# Patient Record
Sex: Male | Born: 1935 | Race: White | Hispanic: No | Marital: Married | Smoking: Former smoker
Health system: Southern US, Community
[De-identification: ages and names within clinical notes are randomized; demographics above are authoritative.]

## PROBLEM LIST (undated history)

## (undated) DIAGNOSIS — I6529 Occlusion and stenosis of unspecified carotid artery: Secondary | ICD-10-CM

## (undated) DIAGNOSIS — M109 Gout, unspecified: Secondary | ICD-10-CM

## (undated) DIAGNOSIS — I4891 Unspecified atrial fibrillation: Secondary | ICD-10-CM

## (undated) DIAGNOSIS — I10 Essential (primary) hypertension: Secondary | ICD-10-CM

## (undated) DIAGNOSIS — E785 Hyperlipidemia, unspecified: Secondary | ICD-10-CM

## (undated) DIAGNOSIS — I2510 Atherosclerotic heart disease of native coronary artery without angina pectoris: Secondary | ICD-10-CM

## (undated) HISTORY — DX: Gout, unspecified: M10.9

## (undated) HISTORY — PX: KNEE SURGERY: SHX244

## (undated) HISTORY — DX: Occlusion and stenosis of unspecified carotid artery: I65.29

## (undated) HISTORY — DX: Essential (primary) hypertension: I10

## (undated) HISTORY — PX: CAROTID ENDARTERECTOMY: SUR193

## (undated) HISTORY — DX: Unspecified atrial fibrillation: I48.91

## (undated) HISTORY — DX: Hyperlipidemia, unspecified: E78.5

## (undated) HISTORY — PX: CARDIAC CATHETERIZATION: SHX172

## (undated) HISTORY — DX: Atherosclerotic heart disease of native coronary artery without angina pectoris: I25.10

---

## 2006-01-08 ENCOUNTER — Ambulatory Visit: Payer: Self-pay | Admitting: Internal Medicine

## 2007-04-26 ENCOUNTER — Ambulatory Visit: Payer: Self-pay

## 2008-08-05 ENCOUNTER — Ambulatory Visit: Payer: Self-pay | Admitting: General Practice

## 2008-08-22 ENCOUNTER — Inpatient Hospital Stay: Payer: Self-pay | Admitting: General Practice

## 2008-11-25 ENCOUNTER — Emergency Department: Payer: Self-pay | Admitting: Emergency Medicine

## 2009-05-31 ENCOUNTER — Emergency Department: Payer: Self-pay | Admitting: Emergency Medicine

## 2012-06-17 ENCOUNTER — Inpatient Hospital Stay: Payer: Self-pay | Admitting: Internal Medicine

## 2012-06-17 LAB — COMPREHENSIVE METABOLIC PANEL
Albumin: 4.1 g/dL (ref 3.4–5.0)
Anion Gap: 7 (ref 7–16)
Bilirubin,Total: 0.7 mg/dL (ref 0.2–1.0)
Calcium, Total: 9 mg/dL (ref 8.5–10.1)
Chloride: 110 mmol/L — ABNORMAL HIGH (ref 98–107)
Co2: 24 mmol/L (ref 21–32)
Creatinine: 2 mg/dL — ABNORMAL HIGH (ref 0.60–1.30)
Osmolality: 286 (ref 275–301)
Potassium: 3.7 mmol/L (ref 3.5–5.1)
SGOT(AST): 20 U/L (ref 15–37)
Total Protein: 7.7 g/dL (ref 6.4–8.2)

## 2012-06-17 LAB — CBC
MCHC: 34.4 g/dL (ref 32.0–36.0)
MCV: 88 fL (ref 80–100)
Platelet: 176 10*3/uL (ref 150–440)
RBC: 5.04 10*6/uL (ref 4.40–5.90)
RDW: 13.2 % (ref 11.5–14.5)

## 2012-06-17 LAB — CK TOTAL AND CKMB (NOT AT ARMC)
CK, Total: 86 U/L (ref 35–232)
CK-MB: 2.4 ng/mL (ref 0.5–3.6)

## 2012-06-17 LAB — URINALYSIS, COMPLETE
Bacteria: NONE SEEN
Bilirubin,UR: NEGATIVE
Blood: NEGATIVE
Glucose,UR: NEGATIVE mg/dL (ref 0–75)
Hyaline Cast: 3
Protein: NEGATIVE
WBC UR: 1 /HPF (ref 0–5)

## 2012-06-17 LAB — TROPONIN I: Troponin-I: <0.02 ng/mL

## 2012-06-18 LAB — BASIC METABOLIC PANEL
Anion Gap: 10 (ref 7–16)
BUN: 21 mg/dL — ABNORMAL HIGH (ref 7–18)
Calcium, Total: 8.5 mg/dL (ref 8.5–10.1)
Chloride: 108 mmol/L — ABNORMAL HIGH (ref 98–107)
Co2: 23 mmol/L (ref 21–32)
Creatinine: 1.64 mg/dL — ABNORMAL HIGH (ref 0.60–1.30)
EGFR (African American): 43 — ABNORMAL LOW
EGFR (Non-African Amer.): 37 — ABNORMAL LOW
Glucose: 93 mg/dL (ref 65–99)
Osmolality: 284 (ref 275–301)
Potassium: 3.7 mmol/L (ref 3.5–5.1)
Sodium: 141 mmol/L (ref 136–145)

## 2012-06-19 LAB — BASIC METABOLIC PANEL
BUN: 30 mg/dL — ABNORMAL HIGH (ref 7–18)
Chloride: 111 mmol/L — ABNORMAL HIGH (ref 98–107)
Co2: 20 mmol/L — ABNORMAL LOW (ref 21–32)
EGFR (African American): 33 — ABNORMAL LOW
EGFR (Non-African Amer.): 28 — ABNORMAL LOW
Glucose: 165 mg/dL — ABNORMAL HIGH (ref 65–99)
Sodium: 140 mmol/L (ref 136–145)

## 2012-06-20 LAB — BASIC METABOLIC PANEL
Anion Gap: 9 (ref 7–16)
EGFR (African American): 37 — ABNORMAL LOW
Glucose: 149 mg/dL — ABNORMAL HIGH (ref 65–99)
Potassium: 4.1 mmol/L (ref 3.5–5.1)

## 2012-06-20 LAB — PROTIME-INR
INR: 1
Prothrombin Time: 13.9 secs (ref 11.5–14.7)

## 2012-07-03 ENCOUNTER — Ambulatory Visit: Payer: Self-pay | Admitting: Internal Medicine

## 2012-07-03 LAB — BASIC METABOLIC PANEL
Anion Gap: 6 — ABNORMAL LOW (ref 7–16)
BUN: 22 mg/dL — ABNORMAL HIGH (ref 7–18)
Co2: 29 mmol/L (ref 21–32)
Creatinine: 1.52 mg/dL — ABNORMAL HIGH (ref 0.60–1.30)
EGFR (African American): 47 — ABNORMAL LOW
Osmolality: 289 (ref 275–301)

## 2012-07-03 LAB — CBC WITH DIFFERENTIAL/PLATELET
Eosinophil #: 0.1 10*3/uL (ref 0.0–0.7)
Eosinophil %: 1.6 %
MCHC: 33.2 g/dL (ref 32.0–36.0)
MCV: 89 fL (ref 80–100)
Monocyte #: 0.5 x10 3/mm (ref 0.2–1.0)
Monocyte %: 7.3 %
Neutrophil #: 5.3 10*3/uL (ref 1.4–6.5)
Neutrophil %: 80.2 %
Platelet: 155 10*3/uL (ref 150–440)
RBC: 4.61 10*6/uL (ref 4.40–5.90)
RDW: 13.4 % (ref 11.5–14.5)

## 2012-07-03 LAB — PROTIME-INR: INR: 1.1

## 2012-07-04 LAB — CK-MB: CK-MB: 3.7 ng/mL — ABNORMAL HIGH (ref 0.5–3.6)

## 2012-07-04 LAB — BASIC METABOLIC PANEL
Anion Gap: 8 (ref 7–16)
Creatinine: 1.58 mg/dL — ABNORMAL HIGH (ref 0.60–1.30)
EGFR (African American): 45 — ABNORMAL LOW
EGFR (Non-African Amer.): 39 — ABNORMAL LOW
Potassium: 4.1 mmol/L (ref 3.5–5.1)

## 2012-07-11 ENCOUNTER — Emergency Department: Payer: Self-pay | Admitting: Emergency Medicine

## 2012-07-11 LAB — PROTIME-INR
INR: 1.2
Prothrombin Time: 15.4 secs — ABNORMAL HIGH (ref 11.5–14.7)

## 2012-07-11 LAB — CBC
HCT: 39.8 % — ABNORMAL LOW (ref 40.0–52.0)
HGB: 12.8 g/dL — ABNORMAL LOW (ref 13.0–18.0)
MCH: 28.4 pg (ref 26.0–34.0)
MCV: 89 fL (ref 80–100)
Platelet: 156 10*3/uL (ref 150–440)
RBC: 4.48 10*6/uL (ref 4.40–5.90)

## 2012-07-11 LAB — COMPREHENSIVE METABOLIC PANEL
Alkaline Phosphatase: 78 U/L (ref 50–136)
Anion Gap: 7 (ref 7–16)
Chloride: 108 mmol/L — ABNORMAL HIGH (ref 98–107)
Co2: 25 mmol/L (ref 21–32)
Creatinine: 1.71 mg/dL — ABNORMAL HIGH (ref 0.60–1.30)
Glucose: 150 mg/dL — ABNORMAL HIGH (ref 65–99)
Osmolality: 283 (ref 275–301)
SGOT(AST): 15 U/L (ref 15–37)
SGPT (ALT): 29 U/L (ref 12–78)
Sodium: 140 mmol/L (ref 136–145)

## 2012-07-11 LAB — URIC ACID: Uric Acid: 5 mg/dL (ref 3.5–7.2)

## 2012-07-11 LAB — TROPONIN I: Troponin-I: 0.05 ng/mL

## 2012-07-11 LAB — CK TOTAL AND CKMB (NOT AT ARMC)
CK, Total: 36 U/L (ref 35–232)
CK-MB: 0.7 ng/mL (ref 0.5–3.6)

## 2012-07-11 LAB — APTT: Activated PTT: 29.2 secs (ref 23.6–35.9)

## 2012-12-20 ENCOUNTER — Ambulatory Visit: Payer: Self-pay | Admitting: Ophthalmology

## 2013-01-02 ENCOUNTER — Ambulatory Visit: Payer: Self-pay | Admitting: Ophthalmology

## 2013-02-24 ENCOUNTER — Inpatient Hospital Stay: Payer: Self-pay | Admitting: Family Medicine

## 2013-02-24 LAB — URINALYSIS, COMPLETE
Glucose,UR: NEGATIVE mg/dL (ref 0–75)
Ketone: NEGATIVE
Nitrite: POSITIVE
Ph: 5 (ref 4.5–8.0)
Specific Gravity: 1.01 (ref 1.003–1.030)
WBC UR: 234 /HPF (ref 0–5)

## 2013-02-24 LAB — COMPREHENSIVE METABOLIC PANEL
Albumin: 3.6 g/dL (ref 3.4–5.0)
Alkaline Phosphatase: 92 U/L (ref 50–136)
Anion Gap: 7 (ref 7–16)
BUN: 25 mg/dL — ABNORMAL HIGH (ref 7–18)
Bilirubin,Total: 1.5 mg/dL — ABNORMAL HIGH (ref 0.2–1.0)
Chloride: 105 mmol/L (ref 98–107)
Glucose: 105 mg/dL — ABNORMAL HIGH (ref 65–99)
Osmolality: 280 (ref 275–301)
Potassium: 3.7 mmol/L (ref 3.5–5.1)
SGOT(AST): 31 U/L (ref 15–37)
Sodium: 138 mmol/L (ref 136–145)
Total Protein: 7.2 g/dL (ref 6.4–8.2)

## 2013-02-24 LAB — CBC
HCT: 43.9 % (ref 40.0–52.0)
HGB: 15 g/dL (ref 13.0–18.0)
MCHC: 34.1 g/dL (ref 32.0–36.0)
MCV: 88 fL (ref 80–100)
RBC: 4.97 10*6/uL (ref 4.40–5.90)
RDW: 16.1 % — ABNORMAL HIGH (ref 11.5–14.5)
WBC: 9.9 10*3/uL (ref 3.8–10.6)

## 2013-02-24 LAB — APTT: Activated PTT: 58.3 secs — ABNORMAL HIGH (ref 23.6–35.9)

## 2013-02-25 LAB — CBC WITH DIFFERENTIAL/PLATELET
Basophil #: 0 10*3/uL (ref 0.0–0.1)
Eosinophil %: 0.2 %
HCT: 36.9 % — ABNORMAL LOW (ref 40.0–52.0)
Lymphocyte #: 0.9 10*3/uL — ABNORMAL LOW (ref 1.0–3.6)
Lymphocyte %: 6.7 %
MCHC: 33.8 g/dL (ref 32.0–36.0)
Monocyte #: 1.7 x10 3/mm — ABNORMAL HIGH (ref 0.2–1.0)
Neutrophil %: 80.5 %
RBC: 4.16 10*6/uL — ABNORMAL LOW (ref 4.40–5.90)
WBC: 14 10*3/uL — ABNORMAL HIGH (ref 3.8–10.6)

## 2013-02-25 LAB — BASIC METABOLIC PANEL
Anion Gap: 7 (ref 7–16)
Anion Gap: 7 (ref 7–16)
BUN: 34 mg/dL — ABNORMAL HIGH (ref 7–18)
Calcium, Total: 8.4 mg/dL — ABNORMAL LOW (ref 8.5–10.1)
Chloride: 103 mmol/L (ref 98–107)
Co2: 25 mmol/L (ref 21–32)
EGFR (African American): 28 — ABNORMAL LOW
EGFR (Non-African Amer.): 25 — ABNORMAL LOW
Glucose: 114 mg/dL — ABNORMAL HIGH (ref 65–99)
Glucose: 116 mg/dL — ABNORMAL HIGH (ref 65–99)
Osmolality: 278 (ref 275–301)
Potassium: 3.8 mmol/L (ref 3.5–5.1)
Potassium: 4.4 mmol/L (ref 3.5–5.1)
Sodium: 136 mmol/L (ref 136–145)

## 2013-02-25 LAB — PROTIME-INR: Prothrombin Time: 34.2 secs — ABNORMAL HIGH (ref 11.5–14.7)

## 2013-02-26 LAB — CBC WITH DIFFERENTIAL/PLATELET
Basophil %: 0.3 %
Eosinophil #: 0.1 10*3/uL (ref 0.0–0.7)
HGB: 12.7 g/dL — ABNORMAL LOW (ref 13.0–18.0)
Lymphocyte %: 5.2 %
MCH: 29.9 pg (ref 26.0–34.0)
MCHC: 33.9 g/dL (ref 32.0–36.0)
MCV: 88 fL (ref 80–100)
Neutrophil %: 83.2 %
RBC: 4.23 10*6/uL — ABNORMAL LOW (ref 4.40–5.90)
WBC: 12.5 10*3/uL — ABNORMAL HIGH (ref 3.8–10.6)

## 2013-02-26 LAB — BASIC METABOLIC PANEL
Chloride: 105 mmol/L (ref 98–107)
Co2: 24 mmol/L (ref 21–32)
Glucose: 118 mg/dL — ABNORMAL HIGH (ref 65–99)
Osmolality: 285 (ref 275–301)
Sodium: 138 mmol/L (ref 136–145)

## 2013-02-26 LAB — PROTIME-INR: INR: 2.8

## 2013-02-27 LAB — BASIC METABOLIC PANEL
Anion Gap: 10 (ref 7–16)
Calcium, Total: 8.9 mg/dL (ref 8.5–10.1)
Chloride: 106 mmol/L (ref 98–107)
Co2: 21 mmol/L (ref 21–32)
Creatinine: 1.71 mg/dL — ABNORMAL HIGH (ref 0.60–1.30)
EGFR (Non-African Amer.): 35 — ABNORMAL LOW
Glucose: 103 mg/dL — ABNORMAL HIGH (ref 65–99)
Osmolality: 281 (ref 275–301)
Potassium: 3.8 mmol/L (ref 3.5–5.1)
Sodium: 137 mmol/L (ref 136–145)

## 2013-02-27 LAB — PROTIME-INR
INR: 2.2
Prothrombin Time: 24.1 secs — ABNORMAL HIGH (ref 11.5–14.7)

## 2013-02-28 LAB — PROTIME-INR
INR: 2
Prothrombin Time: 22.6 secs — ABNORMAL HIGH (ref 11.5–14.7)

## 2013-02-28 LAB — CREATININE, SERUM
Creatinine: 1.72 mg/dL — ABNORMAL HIGH (ref 0.60–1.30)
EGFR (African American): 41 — ABNORMAL LOW

## 2013-03-01 LAB — PROTIME-INR: Prothrombin Time: 25.4 secs — ABNORMAL HIGH (ref 11.5–14.7)

## 2013-03-01 LAB — BASIC METABOLIC PANEL
BUN: 38 mg/dL — ABNORMAL HIGH (ref 7–18)
Calcium, Total: 9.3 mg/dL (ref 8.5–10.1)
Chloride: 104 mmol/L (ref 98–107)
Co2: 24 mmol/L (ref 21–32)
Glucose: 150 mg/dL — ABNORMAL HIGH (ref 65–99)

## 2013-03-01 LAB — CULTURE, BLOOD (SINGLE)

## 2013-06-11 ENCOUNTER — Encounter: Payer: Self-pay | Admitting: Podiatry

## 2013-06-11 ENCOUNTER — Ambulatory Visit (INDEPENDENT_AMBULATORY_CARE_PROVIDER_SITE_OTHER): Payer: Medicare HMO | Admitting: Podiatry

## 2013-06-11 ENCOUNTER — Ambulatory Visit (INDEPENDENT_AMBULATORY_CARE_PROVIDER_SITE_OTHER): Payer: Medicare HMO

## 2013-06-11 VITALS — BP 136/86 | HR 118 | Resp 16 | Ht 71.0 in | Wt 218.0 lb

## 2013-06-11 DIAGNOSIS — M7750 Other enthesopathy of unspecified foot: Secondary | ICD-10-CM

## 2013-06-11 DIAGNOSIS — M79673 Pain in unspecified foot: Secondary | ICD-10-CM

## 2013-06-11 DIAGNOSIS — M79609 Pain in unspecified limb: Secondary | ICD-10-CM

## 2013-06-11 DIAGNOSIS — M109 Gout, unspecified: Secondary | ICD-10-CM

## 2013-06-11 DIAGNOSIS — M779 Enthesopathy, unspecified: Secondary | ICD-10-CM

## 2013-06-11 DIAGNOSIS — M778 Other enthesopathies, not elsewhere classified: Secondary | ICD-10-CM

## 2013-06-11 NOTE — Progress Notes (Signed)
   Subjective:    Patient ID: Daryl Kemp, male    DOB: 1923/07/04, [age] y.o.   MRN: [id number]  HPI Comments: Daryl Atlasve been having problems with gout in both feet. Neither one of them hurt me now. The big toe on my right foot hurt me last night but not bad. Daryl Atlasve been having gout off and off for about 3 - 5 years. i take allopurinol for it when i get it.  Foot Pain      Review of Systems  Constitutional: Negative.   HENT: Negative.   Eyes: Negative.   Respiratory: Negative.   Cardiovascular: Negative.   Gastrointestinal: Negative.   Endocrine: Negative.   Genitourinary: Negative.   Musculoskeletal:       Difficulty walking  Skin: Negative.   Allergic/Immunologic: Negative.   Neurological: Negative.   Hematological: Negative.   Psychiatric/Behavioral: Negative.        Objective:   Physical Exam: I have reviewed his past medical history medications allergies surgeries and social history. Vital signs are stable he is alert and oriented x3. Pulses are strongly palpable bilateral lower extremity. Neurologic sensorium is intact per since once the monofilament. Deep tendon reflexes are intact bilateral. Muscle strength is 5 over 5 dorsiflexors plantar flexors inverters everters all intrinsic musculature is intact.  Orthopedic evaluation Mr. is all joints his ankle a full range of motion without crepitus with exception of the PIPJ his of the toes. They are all somewhat arthritic. He has hallux abductovalgus deformity. A boggy area of bursitis overlying the medial eminence of the head of the first metatarsal the right foot. This is been that it's painful area for him radiographically speaking varus soft tissue increase in density in this area more than likely gouty tophi however this is somewhat boggy rather than firm.       Assessment & Plan:   assessment: Bursitis with gout deposition right first metatarsophalangeal joint of the right foot.  Plan: Aspiration of the bursa today with 3 cc of a  bloody white crystallized tophus. I also injected along into the area.

## 2013-12-31 ENCOUNTER — Ambulatory Visit (INDEPENDENT_AMBULATORY_CARE_PROVIDER_SITE_OTHER): Payer: Medicare HMO | Admitting: Podiatry

## 2013-12-31 ENCOUNTER — Ambulatory Visit (INDEPENDENT_AMBULATORY_CARE_PROVIDER_SITE_OTHER): Payer: Medicare HMO

## 2013-12-31 DIAGNOSIS — M7750 Other enthesopathy of unspecified foot: Secondary | ICD-10-CM

## 2013-12-31 DIAGNOSIS — M779 Enthesopathy, unspecified: Secondary | ICD-10-CM

## 2013-12-31 DIAGNOSIS — M778 Other enthesopathies, not elsewhere classified: Secondary | ICD-10-CM

## 2013-12-31 NOTE — Progress Notes (Signed)
He presents today chief complaint of pain to his right ankle he denies any trauma to it. He states that it bothers him particularly while he is on it but not while his nonweightbearing.  Objective: Vital signs are stable he is alert and oriented x3. His palpation and in range of motion of the subtalar joint and of the sinus tarsi. Radiographic evaluation doesn't demonstrate any type of osseous abnormalities in this area.  Assessment: Subtalar joint capsulitis right.  Plan: Injected the sinus tarsi today with Kenalog and local anesthetic after sterile Betadine skin prep.

## 2014-03-05 ENCOUNTER — Ambulatory Visit (INDEPENDENT_AMBULATORY_CARE_PROVIDER_SITE_OTHER): Payer: Medicare HMO | Admitting: Podiatry

## 2014-03-05 VITALS — BP 91/51 | HR 88 | Resp 16

## 2014-03-05 DIAGNOSIS — M1990 Unspecified osteoarthritis, unspecified site: Secondary | ICD-10-CM

## 2014-03-05 DIAGNOSIS — M7751 Other enthesopathy of right foot: Secondary | ICD-10-CM

## 2014-03-05 DIAGNOSIS — M778 Other enthesopathies, not elsewhere classified: Secondary | ICD-10-CM

## 2014-03-05 DIAGNOSIS — M779 Enthesopathy, unspecified: Secondary | ICD-10-CM

## 2014-03-05 MED ORDER — TRIAMCINOLONE ACETONIDE 10 MG/ML IJ SUSP
10.0000 mg | Freq: Once | INTRAMUSCULAR | Status: AC
  Administered 2014-03-05: 10 mg

## 2014-03-06 NOTE — Progress Notes (Signed)
Patient ID: Daryl Kemp, male   DOB: 30-May-1923, 80 y.o.   MRN: [id number]  Subjective: Daryl Kemp, 80-year-old male, returns the office they with recurrence of pain on the outside aspect of his right foot/ankle. He states that last appointment Dr. Al CorpusHyatt gave him an injection which helped significantly improved his symptoms. He states that over the last couple weeks his shot has been wearing off and he is requesting another injection at this time. He states the pain is in the same area as it was prior. He denies any acute changes since last appointment. No other complaints at this time.  Objective: AAO x3, NAD DP/PT pulses palpable bilaterally, CRT less than 3 seconds Protective sensation intact the Semmes Weinstein monofilament Tenderness to palpation over the lateral aspect of the sinus tarsi. There is limitation in range of motion of the subtalar joint. Ankle joint range of motion is within normal limits and there is no pain or crepitus with ankle range of motion. There is no overlying erythema, edema, increased warmth. No calf pain, swelling, warmth.  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  Plan: -Various she masses discussed including alternatives, risks, complications. -At this time the patient elects to proceed with another steroid injection into the area. Under sterile Betadine preparation a total of 2.5 cc of a mixture of Kenalog 10, 0.5% Marcaine plain, 2% lidocaine plain was infiltrated into the sinus tarsi. Patient tolerated the injection well without complications. -Discussed the patient to ice the area to help prevent a steroid flare. -Followup as needed. Call the office with any questions, concerns, change in symptoms.

## 2014-05-17 ENCOUNTER — Ambulatory Visit: Payer: Self-pay | Admitting: Internal Medicine

## 2014-05-26 LAB — COMPREHENSIVE METABOLIC PANEL
ALBUMIN: 4 g/dL (ref 3.4–5.0)
ALK PHOS: 107 U/L
ALT: 33 U/L
Anion Gap: 6 — ABNORMAL LOW (ref 7–16)
BUN: 28 mg/dL — AB (ref 7–18)
Bilirubin,Total: 0.8 mg/dL (ref 0.2–1.0)
CHLORIDE: 107 mmol/L (ref 98–107)
Calcium, Total: 9.4 mg/dL (ref 8.5–10.1)
Co2: 28 mmol/L (ref 21–32)
Creatinine: 1.86 mg/dL — ABNORMAL HIGH (ref 0.60–1.30)
EGFR (African American): 44 — ABNORMAL LOW
GFR CALC NON AF AMER: 37 — AB
Glucose: 107 mg/dL — ABNORMAL HIGH (ref 65–99)
OSMOLALITY: 287 (ref 275–301)
Potassium: 4.4 mmol/L (ref 3.5–5.1)
SGOT(AST): 34 U/L (ref 15–37)
Sodium: 141 mmol/L (ref 136–145)
Total Protein: 7.5 g/dL (ref 6.4–8.2)

## 2014-05-26 LAB — CBC WITH DIFFERENTIAL/PLATELET
Basophil #: 0.1 10*3/uL (ref 0.0–0.1)
Basophil %: 1.2 %
EOS PCT: 5 %
Eosinophil #: 0.3 10*3/uL (ref 0.0–0.7)
HCT: 51.3 % (ref 40.0–52.0)
HGB: 16.5 g/dL (ref 13.0–18.0)
LYMPHS ABS: 1.1 10*3/uL (ref 1.0–3.6)
Lymphocyte %: 21 %
MCH: 29.9 pg (ref 26.0–34.0)
MCHC: 32.1 g/dL (ref 32.0–36.0)
MCV: 93 fL (ref 80–100)
MONO ABS: 0.5 x10 3/mm (ref 0.2–1.0)
Monocyte %: 9.1 %
NEUTROS ABS: 3.3 10*3/uL (ref 1.4–6.5)
Neutrophil %: 63.7 %
Platelet: 129 10*3/uL — ABNORMAL LOW (ref 150–440)
RBC: 5.52 10*6/uL (ref 4.40–5.90)
RDW: 16.4 % — ABNORMAL HIGH (ref 11.5–14.5)
WBC: 5.2 10*3/uL (ref 3.8–10.6)

## 2014-05-26 LAB — URINALYSIS, COMPLETE
BLOOD: NEGATIVE
Bacteria: NONE SEEN
Bilirubin,UR: NEGATIVE
GLUCOSE, UR: NEGATIVE mg/dL (ref 0–75)
Ketone: NEGATIVE
Nitrite: NEGATIVE
PH: 6 (ref 4.5–8.0)
Protein: NEGATIVE
RBC, UR: NONE SEEN /HPF (ref 0–5)
Specific Gravity: 1.015 (ref 1.003–1.030)
WBC UR: 1 /HPF (ref 0–5)

## 2014-05-26 LAB — TROPONIN I

## 2014-05-27 LAB — FOLATE: FOLIC ACID: 8.4 ng/mL (ref 3.1–17.5)

## 2014-05-27 LAB — TSH
THYROID STIMULATING HORM: 3.51 u[IU]/mL
Thyroid Stimulating Horm: 3.2 u[IU]/mL

## 2014-05-27 LAB — TROPONIN I
Troponin-I: <0.02 ng/mL
Troponin-I: <0.02 ng/mL

## 2014-05-27 LAB — PROTIME-INR
INR: 1.8
Prothrombin Time: 20.2 secs — ABNORMAL HIGH (ref 11.5–14.7)

## 2014-05-28 ENCOUNTER — Inpatient Hospital Stay: Payer: Self-pay | Admitting: Internal Medicine

## 2014-05-29 LAB — PROTIME-INR
INR: 1.7
Prothrombin Time: 19.2 secs — ABNORMAL HIGH (ref 11.5–14.7)

## 2014-05-30 LAB — BASIC METABOLIC PANEL
ANION GAP: 6 — AB (ref 7–16)
BUN: 33 mg/dL — AB (ref 7–18)
CHLORIDE: 105 mmol/L (ref 98–107)
CO2: 30 mmol/L (ref 21–32)
CREATININE: 1.88 mg/dL — AB (ref 0.60–1.30)
Calcium, Total: 8.6 mg/dL (ref 8.5–10.1)
EGFR (African American): 44 — ABNORMAL LOW
EGFR (Non-African Amer.): 36 — ABNORMAL LOW
Glucose: 89 mg/dL (ref 65–99)
Osmolality: 288 (ref 275–301)
Potassium: 3.7 mmol/L (ref 3.5–5.1)
Sodium: 141 mmol/L (ref 136–145)

## 2014-05-30 LAB — PROTIME-INR
INR: 1.8
PROTHROMBIN TIME: 20.7 s — AB (ref 11.5–14.7)

## 2014-05-30 LAB — PLATELET COUNT: PLATELETS: 111 10*3/uL — AB (ref 150–440)

## 2014-05-31 LAB — PROTIME-INR
INR: 1.8
PROTHROMBIN TIME: 20.7 s — AB (ref 11.5–14.7)

## 2014-06-01 LAB — BASIC METABOLIC PANEL
Anion Gap: 7 (ref 7–16)
BUN: 38 mg/dL — AB (ref 7–18)
CHLORIDE: 106 mmol/L (ref 98–107)
Calcium, Total: 8.5 mg/dL (ref 8.5–10.1)
Co2: 29 mmol/L (ref 21–32)
Creatinine: 1.84 mg/dL — ABNORMAL HIGH (ref 0.60–1.30)
EGFR (Non-African Amer.): 37 — ABNORMAL LOW
GFR CALC AF AMER: 45 — AB
GLUCOSE: 121 mg/dL — AB (ref 65–99)
OSMOLALITY: 293 (ref 275–301)
Potassium: 4.6 mmol/L (ref 3.5–5.1)
Sodium: 142 mmol/L (ref 136–145)

## 2014-06-01 LAB — CBC WITH DIFFERENTIAL/PLATELET
Basophil #: 0 10*3/uL (ref 0.0–0.1)
Basophil %: 0.3 %
EOS PCT: 0.8 %
Eosinophil #: 0.1 10*3/uL (ref 0.0–0.7)
HCT: 44.1 % (ref 40.0–52.0)
HGB: 13.9 g/dL (ref 13.0–18.0)
Lymphocyte #: 0.5 10*3/uL — ABNORMAL LOW (ref 1.0–3.6)
Lymphocyte %: 7.1 %
MCH: 29.6 pg (ref 26.0–34.0)
MCHC: 31.6 g/dL — ABNORMAL LOW (ref 32.0–36.0)
MCV: 94 fL (ref 80–100)
MONOS PCT: 9.1 %
Monocyte #: 0.7 x10 3/mm (ref 0.2–1.0)
Neutrophil #: 6.3 10*3/uL (ref 1.4–6.5)
Neutrophil %: 82.7 %
Platelet: 109 10*3/uL — ABNORMAL LOW (ref 150–440)
RBC: 4.69 10*6/uL (ref 4.40–5.90)
RDW: 15.9 % — ABNORMAL HIGH (ref 11.5–14.5)
WBC: 7.6 10*3/uL (ref 3.8–10.6)

## 2014-06-01 LAB — URINALYSIS, COMPLETE
Bilirubin,UR: NEGATIVE
GLUCOSE, UR: NEGATIVE mg/dL (ref 0–75)
Ketone: NEGATIVE
Nitrite: NEGATIVE
PH: 5 (ref 4.5–8.0)
Protein: 30
SQUAMOUS EPITHELIAL: NONE SEEN
Specific Gravity: 1.018 (ref 1.003–1.030)

## 2014-06-01 LAB — PROTIME-INR
INR: 1.7
PROTHROMBIN TIME: 20 s — AB (ref 11.5–14.7)

## 2014-06-01 LAB — APTT: Activated PTT: 31.1 secs (ref 23.6–35.9)

## 2014-06-02 LAB — BASIC METABOLIC PANEL
ANION GAP: 7 (ref 7–16)
BUN: 40 mg/dL — ABNORMAL HIGH (ref 7–18)
CHLORIDE: 108 mmol/L — AB (ref 98–107)
CREATININE: 1.9 mg/dL — AB (ref 0.60–1.30)
Calcium, Total: 8.5 mg/dL (ref 8.5–10.1)
Co2: 26 mmol/L (ref 21–32)
EGFR (African American): 43 — ABNORMAL LOW
EGFR (Non-African Amer.): 36 — ABNORMAL LOW
Glucose: 118 mg/dL — ABNORMAL HIGH (ref 65–99)
Osmolality: 292 (ref 275–301)
POTASSIUM: 4.1 mmol/L (ref 3.5–5.1)
Sodium: 141 mmol/L (ref 136–145)

## 2014-06-02 LAB — CBC WITH DIFFERENTIAL/PLATELET
BASOS ABS: 0 10*3/uL (ref 0.0–0.1)
Basophil %: 0.3 %
EOS ABS: 0.1 10*3/uL (ref 0.0–0.7)
Eosinophil %: 0.7 %
HCT: 39 % — AB (ref 40.0–52.0)
HGB: 12.4 g/dL — ABNORMAL LOW (ref 13.0–18.0)
LYMPHS PCT: 8.8 %
Lymphocyte #: 0.7 10*3/uL — ABNORMAL LOW (ref 1.0–3.6)
MCH: 30.1 pg (ref 26.0–34.0)
MCHC: 31.9 g/dL — AB (ref 32.0–36.0)
MCV: 94 fL (ref 80–100)
MONO ABS: 0.7 x10 3/mm (ref 0.2–1.0)
Monocyte %: 9.7 %
NEUTROS ABS: 6.2 10*3/uL (ref 1.4–6.5)
Neutrophil %: 80.5 %
Platelet: 105 10*3/uL — ABNORMAL LOW (ref 150–440)
RBC: 4.13 10*6/uL — ABNORMAL LOW (ref 4.40–5.90)
RDW: 15.9 % — ABNORMAL HIGH (ref 11.5–14.5)
WBC: 7.7 10*3/uL (ref 3.8–10.6)

## 2014-06-02 LAB — PROTIME-INR
INR: 2
Prothrombin Time: 22.3 secs — ABNORMAL HIGH (ref 11.5–14.7)

## 2014-06-03 LAB — PROTIME-INR
INR: 1.9
Prothrombin Time: 21 secs — ABNORMAL HIGH (ref 11.5–14.7)

## 2014-06-04 LAB — PROTIME-INR
INR: 2
Prothrombin Time: 22.3 secs — ABNORMAL HIGH (ref 11.5–14.7)

## 2014-06-05 ENCOUNTER — Encounter: Payer: Self-pay | Admitting: Internal Medicine

## 2014-06-07 LAB — PROTIME-INR
INR: 1.5
Prothrombin Time: 18.1 secs — ABNORMAL HIGH (ref 11.5–14.7)

## 2014-06-11 LAB — PROTIME-INR
INR: 1.5
PROTHROMBIN TIME: 17.9 s — AB (ref 11.5–14.7)

## 2014-06-17 ENCOUNTER — Encounter: Payer: Self-pay | Admitting: Internal Medicine

## 2014-06-17 ENCOUNTER — Ambulatory Visit: Payer: Self-pay | Admitting: Internal Medicine

## 2014-06-18 LAB — PROTIME-INR
INR: 2.5
PROTHROMBIN TIME: 26.8 s — AB

## 2014-07-16 ENCOUNTER — Encounter
Admit: 2014-07-16 | Disposition: A | Discharge status: Home / Self Care | Payer: Self-pay | Attending: Internal Medicine | Admitting: Internal Medicine

## 2014-08-16 ENCOUNTER — Encounter
Admit: 2014-08-16 | Disposition: A | Discharge status: Home / Self Care | Payer: Self-pay | Attending: Internal Medicine | Admitting: Internal Medicine

## 2014-09-06 NOTE — Consult Note (Signed)
Brief Consult Note: Diagnosis: Left epididymitis. UTI.  BPH.   Patient was seen by consultant.   Consult note dictated.   Recommend further assessment or treatment.   Orders entered.   Discussed with Attending MD.   Comments: Continue antibiotics after discharge for 10 days. Scrotal support. Sitz baths. Follow-up in the office.  Electronic Signatures: Orson ApeWolff, Michael R (MD)  (Signed 15-Oct-14 18:04)  Authored: Brief Consult Note   Last Updated: 15-Oct-14 18:04 by Orson ApeWolff, Michael R (MD)

## 2014-09-06 NOTE — Discharge Summary (Signed)
PATIENT NAME:  Daryl Kemp, Daryl Kemp MR#:  [id number] DATE OF BIRTH:  Apr 06, 1924  DATE OF ADMISSION:  02/24/2013 DATE OF DISCHARGE:   03/02/2013  REASON FOR ADMISSION:  Dysuria, increased frequency and mild diarrhea.   DISCHARGE DIAGNOSES: 1.  Escherichia coli urinary tract infection.  2.  Systemic inflammatory response syndrome.  3.  Atrial fibrillation with rapid ventricular response.  4.  Acute diastolic congestive heart failure.  5.  Pulmonary hypertension.  6.  Fluid overload.  7.  Elevated PSA with hematuria.  8.  Acute kidney injury.  9.  Chronic kidney disease.  10.  Thrombocytopenia.  11.  Acute diarrhea, now resolved.  12.  Gout.  13.  Dementia.  14.  Depression.    15.  Anticoagulation with Coumadin.   DISPOSITION: Skilled nursing facility.   DISCHARGE MEDICATIONS:  Donepezil 10 mg once a day, sertraline 25 mg once a day, pravastatin 40 mg once a day, allopurinol 100 mg once a day, Plavix 75 mg once a day, omeprazole 40 mg once a day, Lasix 20 mg once a day, latanoprost 1 drop each eye at bedtime, prednisone taper started on 50 mg decreasing 10 mg every day for 5 days. Norco 5/325 mg as needed for pain, warfarin 5 mg every day starting tomorrow, buspirone 10 mg twice daily, metoprolol 25 mg take 3 tablets every 12 hours, diltiazem 240 mg once a day, Ceftin 500 mg twice daily, colchicine 0.6 mg once a day for 3 days for gout exacerbation.   FOLLOWUP:  Dr. Dewaine Oats  in 1 to 2 weeks, Evelene Croon in 2 to 4 weeks, Gwen Pounds in 1 to 2 weeks.    HOSPITAL COURSE: An 81 year old gentleman presented to the ER on 02/24/2013, with history of difficulty urinating, having some hematuria. The patient developed some chills, was not feeling very good, had significant increase on the frequency of his urination, getting worse progressively within couple of days.   He was in atrial fibrillation with RVR in the ER with 120 to 130 heart beats and a low-grade fever. The patient was admitted for further  evaluation.   As far as problems:  1.  Systemic inflammatory response syndrome with tachycardia, low-grade fever. The patient was put on antibiotics with Rocephin based on his laboratory  findings looking like a urinary tract infection. Cultures were taken. The patient grew out E. coli on his urine, which was sensitive to multiple medications. The patient was treated with Rocephin in house and discharged with 2 more days of cefuroxime to complete a total course of 10 days. Other than that, the patient has been asymptomatic. He has been feeling fine, afebrile.  2.  Urinary tract infection, as mentioned above.   3.  Elevated PSA. The patient had PSA of around 10.  He has some epididymitis. He has an enlarged prostate. Dr. Evelene Croon was consulted for this, and he is going to follow up outpatient.  4.  Testicular edema and tenderness. The patient actually had an ultrasound that showed the possibility of epididymitis on top of multiple chronic changes that were related to previous trauma. The patient is going to see Dr. Evelene Croon outpatient. He needs to wear a jock strap.  5.  Atrial fibrillation with rapid ventricular response, likely due to infection and stress. He has a heart rate elevated in the 120s to 130s. His medications were adjusted. Now, his heart rate is around 100. I talked to Dr. Gwen Pounds about that. He feels comfortable sending the patient out as long as  his heart rate is around 100 and it does not go above 120. If he goes above 120, adjust the medications.  6.  Acute diastolic congestive heart failure. He has an echocardiogram done in February that showed an ejection fraction of 55% with pulmonary hypertension, 30 to 40 mmHg.  The patient has a beta blocker and Lasix. Daily weights and a low salt diet recommended.  7.  As far as his acute kidney injury, the patient had increase of his creatinine up to 2.37, discharge creatinine 1.59, which is at his baseline chronic kidney disease of 1.5 to 1.7.  8.    Thrombocytopenia. The patient received folic acid; that was secondary to consumption due to the infectious process on the urine.   OTHER PROBLEMS:  The patient actually developed a slight gout exacerbation here in the hospital that was treated with steroids and Colcrys was added on.   The patient was very adamant about going home, as well as his family, and we tried to get him down to [place]. The  patient is going to be discharged in good condition.   I spent about 45 minutes with this discharge.   ____________________________ Felipa Furnaceoberto Sanchez Gutierrez, MD rsg:dmm D: 03/02/2013 11:36:00 ET T: 03/02/2013 11:58:23 ET JOB#: [id number]  cc: Felipa Furnaceoberto Sanchez Gutierrez, MD, <Dictator> Lamar BlinksBruce J. Kowalski, MD Suszanne ConnersMichael R. Evelene CroonWolff, MD Tollie Pizzaimothy J. Orlie DakinFinnegan, MD Jillene Bucksenny C. Arlana Pouchate, MD Regan RakersOBERTO Juanda ChanceSANCHEZ GUTIERRE MD ELECTRONICALLY SIGNED 03/10/2013 23:11

## 2014-09-06 NOTE — Consult Note (Signed)
CHIEF COMPLAINT and HISTORY:   Subjective/Chief Complaint syncope    History of Present Illness Patient admitted after syncopal episode.  Work up has revealed multiple issues including a. fib, CKD  with current Cr at 2 (daughter reports baseline of 1.8), and carotid stenosis.  He did not have focal arm or leg weakness or numbness, aphasia, or speech issues.  No visual symptoms.  His carotid duplex is reported as >75% bilateral ICA stenosis.  The body of the report describes 50-75% right ICA stenosis (and the velocities reported would correlate with this), and >75% left ICA stenosis.    Past History a fib CKD CAS HTN Gout   PAST MEDICAL/SURGICAL HISTORY:  Past Medical History:   Hypertension:    glaucoma:    indigestion:    rt knee surgery:    double hernia: 1980   cholecystectomy: 1985  ALLERGIES:  Allergies:  No Known Allergies:   HOME MEDICATIONS:  Home Medications: Medication Instructions Status  hydrochlorothiazide 25 mg oral tablet 1 tab(s) orally every other day in the morning. Active  amlodipine 10 mg oral tablet 1 tab(s) orally once a day (in the morning) Active  omeprazole 40 mg oral delayed release capsule 1 cap(s) orally once a day (in the morning) Active  donepezil 10 mg oral tablet 1 tab(s) orally once a day (in the morning). Active  busPIRone 10 mg oral tablet 1 tab(s) orally 2 times a day Active  sertraline 25 mg oral tablet 1 tab(s) orally once a day (in the morning). Active  pravastatin 40 mg oral tablet 1 tab(s) orally once a day (in the morning) Active  allopurinol 100 mg oral tablet 1 tab(s) orally once a day (in the morning). Active  aspirin 325 mg oral tablet 1 tab(s) orally once a day (at bedtime). Active  latanoprost 0.005% ophthalmic solution 1 drop(s) to each affected eye once a day (at bedtime) Active   Family and Social History:   Family History Non-Contributory    Social History negative tobacco, negative ETOH    Place of Living Home    Review of Systems:   Subjective/Chief Complaint syncope    Fever/Chills No    Cough No    Sputum No    Abdominal Pain No    Diarrhea No    Constipation No    Nausea/Vomiting No    SOB/DOE No    Chest Pain No    Telemetry Reviewed Afib    Dysuria No    Tolerating PT Yes    Tolerating Diet Yes    Medications/Allergies Reviewed Medications/Allergies reviewed   Physical Exam:   GEN well developed, well nourished, looks younger than stated age    HEENT pink conjunctivae, moist oral mucosa    NECK No masses  trachea midline    RESP normal resp effort  clear BS  no use of accessory muscles    CARD irregular rate  positive carotid bruits  no JVD    ABD denies tenderness  no liver/spleen enlargement    LYMPH negative neck, negative axillae    EXTR negative cyanosis/clubbing, negative edema    SKIN normal to palpation, skin turgor good    NEURO follows commands, motor/sensory function intact    PSYCH A+O to time, place, person   LABS:  Laboratory Results: Thyroid:    02-Feb-14 04:37, Thyroid Stimulating Hormone   Thyroid Stimulating Hormone 2.30   0.45-4.50  (International Unit)   -----------------------  Pregnant patients have   different reference   ranges for  TSH:   - - - - - - - - - -   Pregnant, first trimetser:   0.36 - 2.50 uIU/mL  Hepatic:    01-Feb-14 10:42, Comprehensive Metabolic Panel   Bilirubin, Total 0.7   Alkaline Phosphatase 93   SGPT (ALT) 21   SGOT (AST) 20   Total Protein, Serum 7.7   Albumin, Serum 4.1  Cardiology:    02-Feb-14 06:32, Echo Doppler   Echo Doppler    Interpretation Summary    The study was technically difficult.    The left atrium is borderline dilated. Left ventricular systolic   function is normal. Ejection Fraction = >55%. There is mild   tricuspid regurgitation. Right ventricular systolic pressure is   elevated at 30-23mHg. The aortic valve is normal in structure and   function. The study was  technically difficult. The right ventricle   is mildly dilated.    PatientHeight: 180 cm    PatientWeight: 1962kg    SystolicPressure: 1229mmHg   DiastolicPressure: 67 mmHg    HeartRate: 93 bpm    BSA: 2.2 m2    Procedure:    A two-dimensional transthoracic echocardiogram with color flow and   Doppler was performed.    Left Ventricle    Left ventricular systolic function is normal.    Ejection Fraction = >55%.    Right Ventricle    The right ventricle is mildly dilated.    Atria    The left atrium is borderline dilated.    Tricuspid Valve    The tricuspid valve is not well visualized.    There is mild tricuspid regurgitation.    Right ventricular systolic pressure is elevated at 30-416mg.    Aortic Valve    The aortic valve is normal in structure and function.    MMode 2D Measurements and Calculations    IVSd: 0.99 cm    LVIDd: 4.5 cm    LVIDs: 3.0 cm    LVPWd: 1.1 cm    FS: 32 %    EF(Teich): 61 %    Ao root diam: 3.2 cm    LA dimension: 3.9 cm    LVOT diam: 2.2 cm    Doppler Measurements and Calculations    MV E point: 82 cm/sec    MV P1/2t max vel: 108 cm/sec    MV P1/2t: 36 msec    MVA(P1/2t): 6.1 cm2    MV dec slope: 876 cm/sec2    MV dec time: 0.13 sec    Ao V2 max: 103 cm/sec    Ao max PG: 4.0 mmHg    Ao V2 mean: 77 cm/sec    Ao mean PG: 2.5 mmHg    Ao V2 VTI: 17 cm    AVA(V,D): 2.7 cm2    LV max PG: 2.0 mmHg    LV V1 max: 74 cm/sec    PA V2 max: 96 cm/sec    PA max PG: 4.0 mmHg    TR Max vel: 287 cm/sec    TR Max PG: 33 mmHg    RVSP: 43 mmHg    RAP systole: 10 mmHg    Reading Physician: FaBartholome Bill Sonographer: Berline Lopes RDCS  Interpreting Physician:  KeBartholome Bill electronically signed on   06-18-2012 11:59:32  Requesting Physician: FaBartholome Bill  03-Feb-14 12:40, Stress Test For Nuclear   Protocol LEXISCAN   Max Work Load 10   Total Exercise Time 61   Max Diastolic BP 82   Max  Heart Rate 118   Max Predicted Heart  Rate 134   Reason For Termination    Target Heart Rate Achieved   ECG interpretation    Confirmed by OVERREAD, NOT ([id number]), editor PEARSON, BARBARA ([id number]) on 06/19/2012 1:43:51 PM   Stress Test   Routine Chem:    01-Feb-14 10:42, Comprehensive Metabolic Panel   Glucose, Serum 94   BUN 28   Creatinine (comp) 2.00   Sodium, Serum 141   Potassium, Serum 3.7   Chloride, Serum 110   CO2, Serum 24   Calcium (Total), Serum 9.0   Osmolality (calc) 286   eGFR (African American) 34   eGFR (Non-African American) 29   eGFR values <37m/min/1.73 m2 may be an indication of chronic  kidney disease (CKD).  Calculated eGFR is useful in patients with stable renal function.  The eGFR calculation will not be reliable in acutely ill patients  when serum creatinine is changing rapidly. It is not useful in   patients on dialysis. The eGFR calculation may not be applicable  to patients at the low and high extremes of body sizes, pregnant  women, and vegetarians.   Anion Gap 7    02-Feb-14 067:61 Basic Metabolic Panel (w/Total Calcium)   Glucose, Serum 93   BUN 21   Creatinine (comp) 1.64   Sodium, Serum 141   Potassium, Serum 3.7   Chloride, Serum 108   CO2, Serum 23   Calcium (Total), Serum 8.5   Anion Gap 10   Osmolality (calc) 284   eGFR (African American) 43   eGFR (Non-African American) 37   eGFR values <623mmin/1.73 m2 may be an indication of chronic  kidney disease (CKD).  Calculated eGFR is useful in patients with stable renal function.  The eGFR calculation will not be reliable in acutely ill patients  when serum creatinine is changing rapidly. It is not useful in   patients on dialysis. The eGFR calculation may not be applicable  to patients at the low and high extremes of body sizes, pregnant  women, and vegetarians.    03-Feb-14 0495:09Basic Metabolic Panel (w/Total Calcium)   Glucose, Serum 165   BUN 30   Creatinine (comp) 2.06   Sodium, Serum 140   Potassium, Serum 4.0    Chloride, Serum 111   CO2, Serum 20   Calcium (Total), Serum 8.7   Anion Gap 9   Osmolality (calc) 289   eGFR (African American) 33   eGFR (Non-African American) 28   eGFR values <6061min/1.73 m2 may be an indication of chronic  kidney disease (CKD).  Calculated eGFR is useful in patients with stable renal function.  The eGFR calculation will not be reliable in acutely ill patients  when serum creatinine is changing rapidly. It is not useful in   patients on dialysis. The eGFR calculation may not be applicable  to patients at the low and high extremes of body sizes, pregnant  women, and vegetarians.  Cardiac:    01-Feb-14 10:42, Cardiac Panel   CK, Total 86   CPK-MB, Serum 2.4   Result(s) reported on 17 Jun 2012 at 11:12AM.    01-Feb-14 10:42, Troponin I   Troponin I < 0.02   0.00-0.05  0.05 ng/mL or less: NEGATIVE   Repeat testing in 3-6 hrs   if clinically indicated.  >0.05 ng/mL: POTENTIAL   MYOCARDIAL INJURY. Repeat   testing in 3-6 hrs if   clinically indicated.  NOTE: An increase or decrease   of 30% or  more on serial   testing suggests a   clinically important change  Routine UA:    01-Feb-14 12:04, Urinalysis   Color (UA) Yellow   Clarity (UA) Clear   Glucose (UA) Negative   Bilirubin (UA) Negative   Ketones (UA) Negative   Specific Gravity (UA) 1.017   Blood (UA) Negative   pH (UA) 6.0   Protein (UA) Negative   Nitrite (UA) Negative   Leukocyte Esterase (UA) Negative   Result(s) reported on 17 Jun 2012 at 12:21PM.   RBC (UA) <1 /HPF   WBC (UA) 1 /HPF   Bacteria (UA)    NONE SEEN   Epithelial Cells (UA)    NONE SEEN   Mucous (UA) PRESENT   Hyaline Cast (UA) 3 /LPF   Result(s) reported on 17 Jun 2012 at 12:21PM.  Routine Hem:    01-Feb-14 10:42, Hemogram, Platelet Count   WBC (CBC) 6.1   RBC (CBC) 5.04   Hemoglobin (CBC) 15.2   Hematocrit (CBC) 44.3   Platelet Count (CBC) 176   Result(s) reported on 17 Jun 2012 at 10:53AM.   MCV 88   MCH  30.2   MCHC 34.4   RDW 13.2   RADIOLOGY:  Radiology Results: XRay:    02-Feb-14 13:45, Knee Left Complete   Knee Left Complete   REASON FOR EXAM:    fall, pain, swelling  COMMENTS:       PROCEDURE: DXR - DXR KNEE LT COMP WITH OBLIQUES  - Jun 18 2012  1:45PM     RESULT: Comparison:  None    Findings:    4 views of the left knee demonstrates no acute fracture or dislocation.   There is a large joint effusion.    IMPRESSION:     No acute osseous injury of the left knee.  Dictation Site: 1        Verified By: Jennette Banker, M.D., MD  [place]:    01-Feb-14 16:34, US Carotid Doppler Bilateral   US Carotid Doppler Bilateral   REASON FOR EXAM:    syncope  COMMENTS:       PROCEDURE: [place]  - US CAROTID DOPPLER BILATERAL  - Jun 17 2012  4:34PM     RESULT: Indication: Syncope    Comparison: None    Technique: Gray-scale, color Doppler, and spectral Doppler images were   obtainedof the extracranial carotid artery systems and vertebral   arteries in the neck.    Findings:  On the right, there is mild after sclerotic plaque within the distal CCA     and carotid bulb. There is moderate atherosclerotic plaque within the   proximal ICA.Marland Kitchen Maximum peak systolic velocity in the right CCA is 146   cm/second. Maximum peak systolic velocity in the right ICA is 255   cm/second. Maximum peak systolic velocity in the right ECA is 109   cm/second. The right ICA/CCA ratio is 1.74. This corresponds to a   stenosis of 50-75 %. Antegrade blood flow is documented in the right   vertebral artery.    On the left, there is moderate after carotid plaque within the distal   CCA. There is mild atherosclerotic plaque within the carotid bulb and   proximal ICA.Marland Kitchen Maximum peak systolic velocity in the left CCA is 158   cm/second. Maximum peak systolic velocity in the left ICA is 447   cm/second. Maximum peak systolic velocity in the left ECA is 120   cm/second. The left ICA/CCA ratio is  2.82.This  corresponds to a stenosis   of greater than 75 %. Antegrade blood flow is documented in the left     vertebral artery.    IMPRESSION:    1. There is greater than 75% stenosis of the internal carotid artery   bilaterally, left greater than right.    Dictation Site: 1        Verified By: Jennette Banker, M.D., MD  LabUnknown:    01-Feb-14 13:56, CT Head Without Contrast   PACS Image    01-Feb-14 16:34, US Carotid Doppler Bilateral   PACS Image    02-Feb-14 13:45, Knee Left Complete   PACS Image  CT:    01-Feb-14 13:56, CT Head Without Contrast   CT Head Without Contrast   REASON FOR EXAM:    headache, syncope  COMMENTS:       PROCEDURE: CT  - CT HEAD WITHOUT CONTRAST  - Jun 17 2012  1:56PM     RESULT: Comparison:  None    Technique: Multiple axial images from the foramen magnum to the vertex   were obtained without IVcontrast.    Findings:      There is no evidence of mass effect, midline shift, or extra-axial fluid   collections.  There is no evidence of a space-occupying lesion or   intracranial hemorrhage. There is no evidence of a cortical-based area of     acute infarction. There is generalized cerebral atrophy. There is   periventricular white matter low attenuation likely secondary to   microangiopathy.    The ventricles and sulci are appropriate for the patient's age. The basal   cisterns are patent.    Visualized portions of the orbits are unremarkable. The visualized   portions of the paranasal sinuses and mastoid air cells are unremarkable.   Cerebrovascular atherosclerotic calcifications are noted.    The osseous structures are unremarkable.    IMPRESSION:    No acute intracranial process.        Dictation Site: 1        Verified By: Jennette Banker, M.D., MD   ASSESSMENT AND PLAN:   Assessment/Admission Diagnosis syncope a fib CAS, moderate right, possibly high grade left CKD    Plan His carotid duplex is reported as >75% bilateral ICA  stenosis.  The body of the report describes 50-75% right ICA stenosis (and the velocities reported would correlate with this), and >75% left ICA stenosis.  This needs interrogated further.  Can not have CT scan due to renal issues.  Will need catheter based angiogram for further evaluation.  This was unlikely the cause of his syncope, and this can be done as an outpatient in follow up.  Will use bicarb/nephroprotective agents when done.  Will follow.   Electronic Signatures: Algernon Huxley (MD)  (Signed 03-Feb-14 17:27)  Authored: Chief Complaint and History, PAST MEDICAL/SURGICAL HISTORY, ALLERGIES, HOME MEDICATIONS, Family and Social History, Review of Systems, Physical Exam, LABS, RADIOLOGY, Assessment and Plan   Last Updated: 03-Feb-14 17:27 by Algernon Huxley (MD)

## 2014-09-06 NOTE — Op Note (Signed)
PATIENT NAME:  Daryl Kemp, Daryl Kemp MR#:  [id number] DATE OF BIRTH:  [birth year]  DATE OF PROCEDURE:  01/02/2013  PREOPERATIVE DIAGNOSIS: Visually significant cataract of the left eye.   POSTOPERATIVE DIAGNOSIS: Visually significant cataract of the left eye.   OPERATIVE PROCEDURE: Cataract extraction by phacoemulsification with implant of intraocular lens to left eye.   SURGEON: Galen ManilaWilliam Jaki Steptoe, MD.   ANESTHESIA:  1. Managed anesthesia care.  2. Topical tetracaine drops followed by 2% Xylocaine jelly applied in the preoperative holding area.   COMPLICATIONS: None.   TECHNIQUE:  Stop and chop.   DESCRIPTION OF PROCEDURE: The patient was examined and consented in the preoperative holding area where the aforementioned topical anesthesia was applied to the left eye and then brought back to the Operating Room where the left eye was prepped and draped in the usual sterile ophthalmic fashion and a lid speculum was placed. A paracentesis was created with the side port blade and the anterior chamber was filled with viscoelastic. A near clear corneal incision was performed with the steel keratome. A continuous curvilinear capsulorrhexis was performed with a cystotome followed by the capsulorrhexis forceps. Hydrodissection and hydrodelineation were carried out with BSS on a blunt cannula. The lens was removed in a stop and chop technique and the remaining cortical material was removed with the irrigation-aspiration handpiece. The capsular bag was inflated with viscoelastic and the Tecnis ZCB00 19.5-diopter lens, serial number [id number] was placed in the capsular bag without complication. The remaining viscoelastic was removed from the eye with the irrigation-aspiration handpiece. The wounds were hydrated. The anterior chamber was flushed with Miostat and the eye was inflated to physiologic pressure. 0.1 mL of cefuroxime concentration 10 mg/mL was placed in the anterior chamber. The wounds were found to be water  tight. The eye was dressed with Vigamox. The patient was given protective glasses to wear throughout the day and a shield with which to sleep tonight. The patient was also given drops with which to begin a drop regimen today and will follow-up with me in one day.    ____________________________ Jerilee FieldWilliam L. Breniya Goertzen, MD wlp:dp D: 01/02/2013 14:23:40 ET T: 01/02/2013 17:17:56 ET JOB#: [id number]  cc: Camylle Whicker L. Millianna Szymborski, MD, <Dictator> Jerilee FieldWILLIAM L Zan Orlick MD ELECTRONICALLY SIGNED 01/03/2013 13:37

## 2014-09-06 NOTE — Discharge Summary (Signed)
PATIENT NAME:  Daryl PetersKECK, Daryl Kemp MR#:  [id number] DATE OF BIRTH:  [birth year]  DATE OF ADMISSION:  07/03/2012 DATE OF DISCHARGE:    DISCHARGE DIAGNOSIS:  Progressive anginal symptoms with shortness of breath and abnormal stress test.   HISTORY OF PRESENT ILLNESS: This is an 81 year old male with known cardiovascular risk factors with progressive episodes of shortness of breath and anginal symptoms. The patient underwent a cardiac catheterization showing significant left anterior descending artery stenosis and normal LV systolic function. The patient, therefore, underwent a PCI and stent placement using drug-eluting stent in the left anterior descending artery without complication. The patient was ambulating well without any further significant symptoms or issues on appropriate medications and no complications. The patient also had atrial fibrillation, hypertension and hyperlipidemia, all well controlled. The patient, therefore, was discharged home in good condition with below medications.  MEDICATIONS: 1.  Donepezil 10 mg p.o. daily.  2.  Buspirone 10 mg p.o. daily.  3.  Sertraline 25 mg p.o. daily.  4.  Pravastatin 40 mg p.o. daily.  5.  Allopurinol 100 mg p.o. daily. 6.  Amlodipine 2.5 mg p.o. daily.  7.  Metoprolol 25 mg p.o. daily.  8.  Aspirin 81 mg p.o. daily. 9.  Plavix 75 mg p.o. daily.   He is to follow-up with Dr. Gwen PoundsKowalski in 2 weeks for further evaluation and possible reinstatement of triple therapy including Coumadin.    ____________________________ Lamar BlinksBruce J. Kowalski, MD bjk:ce D: 07/04/2012 08:12:53 ET T: 07/04/2012 11:14:59 ET JOB#: [id number]  cc: Lamar BlinksBruce J. Kowalski, MD, <Dictator> Lamar BlinksBRUCE J KOWALSKI MD ELECTRONICALLY SIGNED 07/14/2012 8:54

## 2014-09-06 NOTE — Consult Note (Signed)
Brief Consult Note: Diagnosis: AFIB-RVR/UTI/Dehydration.   Patient was seen by consultant.   Consult note dictated.   Recommend further assessment or treatment.   Orders entered.   Discussed with Attending MD.   Comments: IMP AFIB-RVR UTI Diarrhea Dehydration Gout Depression Dementia GERD . PLAN Tele Cardiazem Continue coumadin Hydration IV Antibx broad spectrum ECHO Agree with urology input Continue Protonix I do not rec cardioversion at this point No indiction for cath.  Electronic Signatures: Dorothyann Pengallwood, Naesha Buckalew D (MD)  (Signed 16-Oct-14 06:57)  Authored: Brief Consult Note   Last Updated: 16-Oct-14 06:57 by Alwyn Peaallwood, Deshawn Skelley D (MD)

## 2014-09-06 NOTE — Consult Note (Signed)
PATIENT NAME:  Daryl Daryl Kemp, Daryl Daryl Kemp#:  [id number] DATE OF BIRTH:  January 09, 1924  DATE OF CONSULTATION:  02/28/2013  REFERRING PHYSICIAN:  Dr. Sharen HonesGutierrez CONSULTING PHYSICIAN:  Suszanne ConnersMichael R. Evelene CroonWolff, MD  REASON FOR CONSULTATION: Testicular pain.   HISTORY OF PRESENT ILLNESS: Daryl Kemp. Daryl Daryl Kemp is an 81 year old Caucasian male who presented to the Emergency Room for hospitalization due to urinary frequency, dysuria and diarrhea. He apparently was found to have urinary sepsis. He also developed left testicular pain a few days ago. His pain is actually absent now. He was noted to have scrotal swelling during this hospitalization prompting scrotal ultrasound yesterday, which indicated possible epididymitis versus epididymal cyst. He has a long history of BPH and actually had a prostate biopsy 5 or 6 years ago, which was benign. His urologist is no longer practicing in this area, however.   PAST MEDICAL HISTORY:   ALLERGIES: No drug allergies.  CHRONIC HOME MEDICATIONS: Included allopurinol, amlodipine, aspirin, buspirone, Aricept, Lasix, metoprolol, Protonix, Plavix, Pravachol, Zoloft and Coumadin.  PAST SURGICAL HISTORY: The patient denied any prior surgical procedures.   SOCIAL HISTORY: He quit smoking 10 years ago with a greater than 30 pack-year history.   FAMILY HISTORY: Negative for prostate cancer.   PAST AND CURRENT MEDICAL CONDITIONS:  1.  Atrial fibrillation.  2.  Gout.  3.  Hypertension.  4.  Dementia. 5.  GERD. 6.  Coronary artery disease.  7.  Hyperlipidemia.  8.  Depression.   REVIEW OF SYSTEMS: The patient denied flank pain or hematuria. He does have urinary frequency, nocturia and a weak urinary stream. He denied history of kidney stones. He does not recall having had a recent DRE or PSA. He denied history of prostatitis. He states that his scrotum has been swollen for many years.   PHYSICAL EXAMINATION: GENERAL: Elderly white male in no acute distress.  LUNGS: Clear to auscultation.   CARDIOVASCULAR: Regular rhythm and rate without audible murmurs.  ABDOMEN: Soft, nontender abdomen.  GENITOURINARY: Uncircumcised with buried penis. He had scrotal edema. Left epididymis was swollen and tender. Right testis was smooth and nontender, 25 mL in size.  RECTAL: 40 gram, smooth nontender prostate.   LABORATORY AND RADIOLOGICAL DATA: Scrotal ultrasound report dated October 14 was reviewed. Urine culture dated October 11 indicated greater than 100,000 colonies of E. coli. Creatinine was 1.72 on October 15. PSA was 11.6 on October 12.   IMPRESSION: 1.  Left epididymitis.  2.  Urinary tract infection.  3.  Benign prostatic hypertrophy with lower urinary tract symptoms. 4.  Elevated PSA.   PLAN: 1.  Continue the patient on p.o. antibiotics after discharge for 10 days. 2.  Scrotal support and sitz baths.  3.  Will need to repeat the PSA after the acute infection has resolved and consider further evaluation if it remains elevated at that time.  4.  Consider treatment for BPH and bladder outlet obstruction after discharge.    ____________________________ Suszanne ConnersMichael R. Evelene CroonWolff, MD mrw:jm D: 02/28/2013 18:14:43 ET T: 02/28/2013 19:15:40 ET JOB#: [id number]  cc: Suszanne ConnersMichael R. Evelene CroonWolff, MD, <Dictator> Dr. Suan HalterGutierrez Denny C. Arlana Pouchate, MD Orson ApeMICHAEL R Lailana Shira MD ELECTRONICALLY SIGNED 03/01/2013 9:46

## 2014-09-06 NOTE — H&P (Signed)
PATIENT NAME:  Daryl Kemp, Daryl Kemp MR#:  [id number] DATE OF BIRTH:  [birth year]  DATE OF ADMISSION:  06/17/2012  PRIMARY CARE PHYSICIAN:  Dr. Dewaine Oatsenny Tate.  EMERGENCY DEPARTMENT REFERRING PHYSICIAN:  Dr. Margarita GrizzleWoodruff.  CHIEF COMPLAINT:  Multiple episodes of syncope with collapse.   HISTORY OF PRESENT ILLNESS:  The patient is an 81 year old white male who reports that over the past 2 to 3 weeks he has had episodes of syncope.  He states that it happens usually when he gets up and he starts feeling very dizzy and he has collapsed to the floor.  He reports that over the past week the frequency has increased.  Also started over the past few weeks he has been having some shortness of breath.  The patient was seen by cardiology and had a stress test that was set up.  He denies any chest pain.  He does report of feeling like his heart starts racing fast from time to time.  In the ED they did an orthostatic check and his blood pressure did not drop, but his heart rate did increase significantly.  The patient also in the ED was noted to have A-Fib.  He reports that he was told by Dr. Arlana Pouchate that they also noticed he had an irregular heart rhythm.  There is some question whether Dr. Arlana Pouchate had also noticed this type of irregular heart rhythm a few years prior.  The patient otherwise denies any fevers, chills.  No nausea, vomiting, diarrhea.   PAST MEDICAL HISTORY: 1.  History of hypertension.  2.  Hyperlipidemia.  3.  GERD.  4.  History of glaucoma.  5.  Depression.  6.  Gout.  7.  Has elevated creatinine.  He reports it was thought to be due to gout medications.  Baseline creatinine is not available.   PAST SURGICAL HISTORY: 1.  Status post right knee surgery.  2.  Status post double hernia repair.  3.  Status post cholecystectomy.   ALLERGIES:  None.   CURRENT MEDICATIONS AT HOME:  He is on allopurinol 100 daily, amlodipine 10 daily, aspirin 325 mg by mouth at bedtime, buspirone 10 mg 1 tab by mouth twice daily,   10 mg 1 tab by  mouth at bedtime, hydrochlorothiazide 25 mg by mouth at bedtime, latanoprost 0.005% ophthalmic solution one drop to each affected eye at bedtime, omeprazole 40 daily, pravastatin 40 daily, Sertraline 25 1 tab by mouth at bedtime.   SOCIAL HISTORY:  History of smoking, quit a long time ago.  He smoked for 38 years.  No alcohol or drug use.   FAMILY HISTORY:  Positive for hypertension.   REVIEW OF SYSTEMS:  CONSTITUTIONAL:  Denies any fevers.  Complains of some fatigueness, weakness.  No pain or weight loss.  No weight gains.   EYES:  No blurred or double vision.  No pain or redness.  No inflammation.  Does have cataracts.  EARS, NOSE, THROAT:  Denies any tinnitus, ear pain.  No hearing loss.  No seasonal or year-round allergies.  No difficulty swallowing.  RESPIRATORY:  Denies any cough, wheezing, hemoptysis.  Complains of dyspnea on exertion.  CARDIOVASCULAR:  Denies any chest pains.  No orthopnea.  No edema.  Does complain of palpitations.  GASTROINTESTINAL:  No nausea, vomiting, diarrhea.  No abdominal pain.  No hematemesis.  No melena.  No ulcers.  GENITOURINARY:  Denies any dysuria, hematuria, renal calculus or frequency.  ENDOCRINE:  Denies any polyuria, nocturia or thyroid problems.  HEMATOLOGY AND LYMPHATIC:  Denies anemia, easy bruisability or bleeding.  SKIN:  No acne.  No rash.  No changes in mole, hair, skin.  MUSCULOSKELETAL:  Denies any pain in neck, back or shoulder.  Does have some bilateral hip pain, reports history of sciatica. NEUROLOGIC:  No numbness.  No CVA.  No TIA.   PSYCHIATRIC:  Does have some depression, anxiety.   PHYSICAL EXAMINATION: VITAL SIGNS:  Temperature 98, pulse 84, respirations 18, blood pressure 115/70, O2 97%.  GENERAL:  The patient is well-developed, well-nourished male in no acute distress.  HEENT:  Head atraumatic, normocephalic.  Pupils equally round, reactive to light and accommodation.  There is no JVD.  No carotid bruits.   CARDIOVASCULAR:  Irregularly irregular rhythm.  No murmurs, rubs, clicks or gallops.  PMI is not displaced.  LUNGS:  Clear to auscultation bilaterally without any rales, rhonchi, wheezing.  ABDOMEN:  Soft, nontender, nondistended.  Positive bowel sounds x 4.  EXTREMITIES:  There is no clubbing, cyanosis or edema.  SKIN:  No rash.  LYMPHATICS:  No lymph nodes palpable.  VASCULAR:  Good DP, PT pulses.  PSYCHIATRIC:  Not anxious or depressed.  NEUROLOGICAL:  Awake, alert, oriented x 3.  No focal deficits.   LABORATORY DATA:  UA, nitrites negative, leukocytes negative.  WBC 6.1, hemoglobin 15.2, platelet count 176.  BMP, glucose 94, BUN 28, creatinine 0.20, sodium 141, potassium 3.7, chloride 110, CO2 is 24.  LFTs are normal.  Troponin less than 0.02.  CPK 86, CK-MB 2.4.  EKG, A-Fib with nonspecific ST-T wave changes.   ASSESSMENT AND PLAN:  The patient is an 81 year old white male with history of hypertension, hyperlipidemia, gastroesophageal reflux disease, presents with recurrent episode of syncope, 4 episodes within the past few weeks.  1.  Syncope with collapse.  The patient has no blood changes with standing, however heart rate increases by 30 with standing.  He also has atrial fibrillation on EKG.  Differential diagnosis could be sick sinus syndrome.  It could be that his atrial fibrillation is uncontrolled with activity and causing the shortness of breath and elevated heart rate.  The patient had a 48 hours monitor done as outpatient by cardiology, results unknown.  At this time we will place him on tele.  We will get an echocardiogram, carotids.  We will ask cardiology to see.  2.  Atrial fibrillation, unsure of duration.  CHADS score II, based on his age and high blood pressure.  I will start him on Lovenox for now.  We will await cardiology evaluation to decide if he needs chronic anticoagulation with either Coumadin or other available anticoagulants or just aspirin will suffice.  3.   Hyperlipidemia.  We will continue simvastatin.  4.  Gastroesophageal reflux disease.  Continue omeprazole.  5.  Hypertension.  We will discontinue HCTZ due to elevated creatinine.  He may benefit from metoprolol, however we will monitor his heart rate on tele before starting this.  6.  Gout.  Continue allopurinol.  7.  Depression.  Continue buspirone and Sertraline.  8.  Miscellaneous.  The patient will be on Lovenox for deep vein thrombosis prophylaxis.   TIME SPENT:  Forty-five minutes spent on this Kemp and P.      ____________________________ Lacie Scotts. Allena Katz, MD shp:ea D: 06/17/2012 15:14:56 ET T: 06/18/2012 06:56:46 ET JOB#: [id number]  cc: Fleur Audino Kemp. Allena Katz, MD, <Dictator> Charise Carwin MD ELECTRONICALLY SIGNED 06/18/2012 16:34

## 2014-09-06 NOTE — H&P (Signed)
PATIENT NAME:  Daryl PetersKECK, Daryl Kemp MR#:  [id number] DATE OF BIRTH:  11-Mar-1924  DATE OF ADMISSION:  02/24/2013  PRIMARY CARE PHYSICIAN:  Dr. Dewaine Oatsenny Tate  CHIEF COMPLAINT:  Dysuria and frequency urination and diarrhea.   HISTORY OF PRESENT ILLNESS: This is an 81 year old male who presents to the hospital due to having difficulty urinating and also having some mild hematuria. The patient said he developed some chills a few days back, but did not have a documented fever, as per him. The patient said that he has been having difficulty urinating and also having frequency of urination, with burning on urination. The patient's symptoms have been progressively getting worse, and he has been feeling more and more weak. He therefore came to the ER for further evaluation. In the Emergency Room, patient was noted to be in rapid atrial fibrillation, with heart rates in the 120s to 130s, and also was noted to have a low-grade fever and abnormal urinalysis consistent with a urinary tract infection. Hospitalist services were contacted for further treatment and evaluation. The patient has baseline underlying dementia; therefore, the history is sort of limited. The patient does complain of diarrhea, which is nonbloody in nature. Does not have any nausea or vomiting. He does admit to having lower abdominal pain. No chest pain, no shortness of breath, and no other associated symptoms presently.   REVIEW OF SYSTEMS:   CONSTITUTIONAL: Positive low grade fever of 99. No weight gain or weight loss.  EYES: No blurry or double vision.  ENT: No tinnitus. No postnasal drip. No redness of the oropharynx.  RESPIRATORY: No cough, no wheeze, no hemoptysis, no dyspnea.  CARDIOVASCULAR: No chest pain, no orthopnea, no palpitations, no syncope.  GASTROINTESTINAL: No nausea, no vomiting. Positive diarrhea. Positive lower abdominal pain. No melena or hematochezia.  GENITOURINARY: Positive dysuria. Positive hematuria. Positive frequency.   ENDOCRINE: No polyuria or nocturia. No heat or cold intolerance.  HEMATOLOGIC: No anemia, no acute bruising or bleeding.  INTEGUMENT: No rashes. No lesions.  MUSCULOSKELETAL: No arthritis, no swelling, no gout.  NEUROLOGIC: No numbness or tingling. No ataxia. No seizure-type activity.  PSYCHIATRIC: No anxiety. No insomnia. No ADD.   PAST MEDICAL HISTORY: Consistent with chronic atrial fibrillation, gout, hypertension, dementia, GERD, history of coronary artery disease, hyperlipidemia, depression.   ALLERGIES: No known drug allergies.   SOCIAL HISTORY: Used to be a smoker, about a 30 pack-year smoking history. No alcohol abuse. No illicit drug abuse. Lives with his son.   FAMILY HISTORY: Both mother and father are deceased, he cannot recall from what.   CURRENT MEDICATIONS: The patient cannot recall his current medications; this is based off his previous discharge summary. Tylenol with oxycodone 5/325, 1 to 2 tabs every 4 to 6 hours as needed. Allopurinol 100 mg daily. Amlodipine 2.5 mg daily. Aspirin 81 mg daily. Buspirone 10 mg b.i.d. Aricept 10 mg daily. Lasix 20 mg b.i.d. Metoprolol tartrate 12.5 mg b.i.d. Protonix 40 mg daily. Plavix 75 mg daily. Pravachol 40 mg daily. Zoloft 25 mg at bedtime. The patient apparently is also on Coumadin, but he does not recall his dose.   PHYSICAL EXAMINATION PRESENTLY IS AS FOLLOWS: VITAL SIGNS: Temperature 100, pulse 145, respirations 18, blood pressure 102/64, sats 98% on 2 liters nasal cannula. GENERAL:  He is a pleasant-appearing male in mild distress.  HEAD, EYES, EARS, NOSE, THROAT EXAM: The patient is atraumatic, normocephalic. Extraocular muscles are intact. Pupils are equal and reactive to light. Sclerae are anicteric. No conjunctival injection. No oropharyngeal  erythema.  NECK: Supple. There is no jugular venous distention. No bruits, no lymphadenopathy, no thyromegaly.  HEART EXAM:  Irregular. No murmurs, no rubs, no clicks.  LUNGS:  Clear to  auscultation bilaterally. No rales, no rhonchi, no wheezes.  ABDOMEN:  Soft, flat. Tender the hypogastric area. No rebound, no rigidity. Good bowel sounds. No hepatosplenomegaly appreciated.  EXTREMITIES:  No evidence of any cyanosis, clubbing, or peripheral edema. Has +2 pedal and radial pulses bilaterally.  NEUROLOGICAL: The patient is alert, awake, oriented x 2. Moves all extremities spontaneously. No other focal motor or sensory deficits appreciated bilaterally.  SKIN:  Moist and warm, with no rash appreciated.  LYMPHATIC: There is no cervical or axillary lymphadenopathy.   LABORATORY EXAM:  Shows serum glucose of 105, BUN 25, creatinine 1.7, sodium 138, potassium 3.7, chloride 105, bicarb 26. LFTs are within normal limits. White cell count 9.9, hemoglobin 15, hematocrit 43.9, platelet count of 131. INR is 3.2. The patient's urinalysis shows positive nitrites, 3+ leukocyte esterase, with 200 white cells, and 1+ bacteria.   ASSESSMENT AND PLAN: This is an 81 year old male with past medical history of chronic atrial fibrillation, hypertension, gout, depression, hyperlipidemia, gastroesophageal reflux disease, who presents to the hospital due to hematuria, dysuria, low-grade fever, and noted to have a urinary tract infection.   1.  Systemic inflammatory response syndrome. This is likely secondary to the urinary tract infection. The patient presented with low-grade fever, tachycardia, and abnormal urinalysis. I will treat the patient supportively with IV fluids, give him IV Rocephin,  follow urine and blood cultures, and follow his fever curve.  2.  Urinary tract infection. Continue IV Rocephin. Follow urine cultures. The patient seems to have some urinary retention. I will do a bladder scan. If he has greater than 300 mL of urine in his bladder, I will place a Foley catheter. Consider getting a Urology consult if that is a problem.  3.  Acute diarrhea. The exact etiology of this is unclear presently.  For now, I will check stool for Clostridium difficile and comprehensive culture, and continue supportive care for now. 4.  History of chronic atrial fibrillation. The patient's rates are significantly uncontrolled right now due to his sepsis. I will continue metoprolol for rate control. If needed, will consider getting pulse doses of IV Cardizem. The patient is already on Coumadin. His INR is therapeutic.  5.  Gout. Continue allopurinol. No acute gout attack.  6.  Dementia. Continue Aricept.  7.  Depression. Continue Zoloft.  8.  Gastroesophageal reflux disease. Continue Protonix.   The patient is a FULL CODE.   Time spent on admission is 50 minutes.     ____________________________ Rolly Pancake. Cherlynn Kaiser, MD vjs:mr D: 02/24/2013 19:15:58 ET T: 02/24/2013 19:45:32 ET JOB#: [id number]  cc: Rolly Pancake. Cherlynn Kaiser, MD, <Dictator> Houston Siren MD ELECTRONICALLY SIGNED 02/25/2013 13:37

## 2014-09-06 NOTE — Consult Note (Signed)
General Aspect 81 yo male with history of atrial fibrillation, hypertension, hyperlipidemia and ckd who was admitted after being brought to the er by his family for furhter workup of dizziness and synope. He has had multiple episodes of synoope or near syncope for several weeks. He was evaluated by Dr. Gwen Pounds as outpatieent with holger last week which revealed afib with vr of average vr of 990 with maximum of 120 and minimum of 73. Now pauses. PVCs present. Pt states he had no symptoms while wearing the device. Echo is pending and was scuduled for functional study as outpatient for next week. His episodes appear to predominiatly be secondary to orthostatic changes and he is being treated with amolodipine and hctz for htn. He only takes the hctz intermitantly. He did not have a syncopal episode at the time of admission. His family wanted admission to further work up his symptoms. He has ruled out for an mi and ekg is unremarkable.   Physical Exam:   GEN well developed, well nourished, no acute distress    HEENT PERRL, hearing intact to voice    NECK supple    RESP normal resp effort  clear BS    CARD Irregular rate and rhythm  Murmur    Murmur Systolic    Systolic Murmur axilla    ABD denies tenderness  normal BS    LYMPH negative neck, negative axillae    EXTR negative cyanosis/clubbing    SKIN normal to palpation    NEURO cranial nerves intact, motor/sensory function intact    PSYCH A+O to time, place, person, anxious   Review of Systems:   Subjective/Chief Complaint positional syncope and near syncope    General: Weakness    Skin: No Complaints    ENT: No Complaints    Eyes: No Complaints    Neck: No Complaints    Respiratory: No Complaints    Cardiovascular: No Complaints    Gastrointestinal: No Complaints    Genitourinary: No Complaints    Vascular: No Complaints    Musculoskeletal: No Complaints    Neurologic: Dizzness  Fainting    Hematologic: No  Complaints    Endocrine: No Complaints    Psychiatric: No Complaints    Review of Systems: All other systems were reviewed and found to be negative    Medications/Allergies Reviewed Medications/Allergies reviewed     Hypertension:    glaucoma:    indigestion:    rt knee surgery:    double hernia: 1980   cholecystectomy: 1985  Home Medications: Medication Instructions Status  hydrochlorothiazide 25 mg oral tablet 1 tab(s) orally every other day in the morning. Active  amlodipine 10 mg oral tablet 1 tab(s) orally once a day (in the morning) Active  omeprazole 40 mg oral delayed release capsule 1 cap(s) orally once a day (in the morning) Active  donepezil 10 mg oral tablet 1 tab(s) orally once a day (in the morning). Active  busPIRone 10 mg oral tablet 1 tab(s) orally 2 times a day Active  sertraline 25 mg oral tablet 1 tab(s) orally once a day (in the morning). Active  pravastatin 40 mg oral tablet 1 tab(s) orally once a day (in the morning) Active  allopurinol 100 mg oral tablet 1 tab(s) orally once a day (in the morning). Active  aspirin 325 mg oral tablet 1 tab(s) orally once a day (at bedtime). Active  latanoprost 0.005% ophthalmic solution 1 drop(s) to each affected eye once a day (at bedtime) Active  EKG:   Interpretation afib with controlled vr    No Known Allergies:     Impression 81 yo male with history of afib and multiple near syncopal and syncopal episodes. 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 Holter was unremarkable. He did not have any symptoms with the holter. He appears to have most of his symptoms when going from reclining or seated postion to upright. Certainly arrythmia may be playing a role as well. Echo and carotid dopplers pending.  Pt was scheduled for funcitonal study as outpatient. Will schedule in am although family is concerned about possible invasxive cardiac evaluation. Would consider this if funcitonal study is abnormal or  patient develops ischemic symptoms.    Plan 1. Conintue to monitor on telemetry. 2. Echo pending 3. Review carotid dopplers 4. Schedule functional study for am 5. Discuss with Dr. Gwen PoundsKowalski in am.  6. Further recs pending course and results of above studies.  7. Conisder altering antihypertensives to avoid orthostatic changes   Electronic Signatures: Dalia HeadingFath, Yardley Lekas A (MD)  (Signed 02-Feb-14 11:46)  Authored: General Aspect/Present Illness, History and Physical Exam, Review of System, Past Medical History, Home Medications, EKG , Allergies, Impression/Plan   Last Updated: 02-Feb-14 11:46 by Dalia HeadingFath, Cheron Pasquarelli A (MD)

## 2014-09-06 NOTE — Discharge Summary (Signed)
PATIENT NAME:  Daryl Kemp, Daryl Kemp MR#:  [id number] DATE OF BIRTH:  07/20/1923  DATE OF ADMISSION:  06/17/2012 DATE OF DISCHARGE:  06/20/2012  PRIMARY CARE PHYSICIAN: Katherina Right C. Arlana Pouch, MD   DISCHARGE DIAGNOSES: 1.  Acute renal failure over chronic kidney disease.  2.  Atrial fibrillation with rapid ventricular rate.  3.  Syncope.  4.  Right knee gout.  5.  Hypertension.  6.  Bilateral carotid stenosis.  7.  Orthostatic syncope.   CONSULTANTS: Dr. Wyn Quaker of vascular surgery and Dr.  Gwen Pounds of cardiology.   IMAGING STUDIES:  Include CT scan of the head without contrast, which showed no acute intracranial process.   Bilateral carotid Doppler showed greater than 75% stenosis on both sides.   Left knee x-ray showed no dislocation or fracture.   Stress test nuclear showed 62% ejection fraction and normal myocardial perfusion.   Echocardiogram showed ejection fraction greater than 55%, borderline dilated left atrium, RVSP of 30 to 40 mmHg.     ADMITTING HISTORY AND PHYSICAL: Please see detailed history and physical dictated on 06/17/2012. In brief, an 81 year old male patient with history of hypertension, presented the hospital were feeling dizzy and syncopal episodes. He was found to have atrial fibrillation and was admitted to the hospitalist service for further work-up and treatment.   HOSPITAL COURSE: 1.  Atrial fibrillation with rapid ventricular rate. The patient did have atrial fibrillation with tachycardia into the one teens. He was started on low-dose metoprolol and his heart rate is much well controlled. He was on therapeutic Lovenox during the hospital stay, has been started on Coumadin and is being discharged home. The patient has been advised to take caution since he is on Coumadin and is at high risk of bleeding and his family members were present during the discussion. Dr. Gwen Pounds of cardiology did see the patient. The patient had a stress test done which showed no ischemia.  Echocardiogram showed mildly dilated left atrium. He will follow up with Dr. Gwen Pounds.  He has been given lab script to check PT, INR in 3 days.  2.  Acute renal failure over chronic kidney disease. The patient's baseline creatinine unknown, but seems to be around 1.6. He is at 1.8, trending down and is being discharged home.  3.  Bilateral carotid stenosis. The patient had an ultrasound of the carotids done which showed bilateral greater than 75% carotid stenosis. Dr. Wyn Quaker of vascular surgery was consulted, who says to have patient follow-up an arteriography, which contrast cannot be done at this time. His BUN and creatinine will be followed and carotid arteriography to be done as out patient once BUN/Cr improve 4.  Gout. The patient did have a right knee swelling and was started on prednisone for the same, in which he improved well and he will be on prednisone for 3 more days after discharge and stop it.  5.  Prior to discharge, his temperature is 97.4 pulse 70, blood pressure 107/63.   DISCHARGE MEDICATIONS: Include:  1.  Omeprazole 40 mg oral once a day.  2.  Donepezil 10 mg oral once a day.  3.  Buspirone  10 mg oral 2 times a day.  4.  Sertraline 25 mg oral once a day.  5.  Pravastatin 40 mg oral once a day.  6.  Allopurinol 100 mg oral once a day.  7.  Amlodipine 2.5 mg oral once a day.  8.  Aspirin 81 mg oral once a day.  9.  Metoprolol tartrate 12.5  mg oral twice a day.  10.  Coumadin 5 mg oral once a day.  11.  Prednisone 10 mg oral once a day for 3 days.   DISCHARGE INSTRUCTIONS:  The patient discharged home on a low-sodium diet. Activity as tolerated. Check PT, INR in 3 days on 06/23/2012, with lab results sent to Dr. Arlana Pouchate and Dr. Gwen PoundsKowalski. Follow up with Dr. Gwen PoundsKowalski and Dr. Arlana Pouchate in 1 to 2 weeks. Follow up with Dr. Wyn Quakerew in 1 to 2 weeks for carotids. This plan was discussed with the patient and his family at bedside, who verbalized understanding and is okay with the plan.   TIME SPENT:  Time spent on day of discharge in discharge activity was 35 minutes.     ____________________________ Molinda BailiffSrikar R. Louann Hopson, MD srs:cc D: 06/20/2012 15:37:06 ET T: 06/20/2012 22:03:15 ET JOB#: [id number]  cc: Wardell HeathSrikar R. Kerry-Anne Mezo, MD, <Dictator> Jillene Bucksenny C. Arlana Pouchate, MD Annice NeedyJason S. Dew, MD Dr. Gwen PoundsKowalski    Orie FishermanSRIKAR R Zuriah Bordas MD ELECTRONICALLY SIGNED 06/22/2012 13:27

## 2014-09-09 LAB — SURGICAL PATHOLOGY

## 2014-09-15 NOTE — Consult Note (Signed)
Referring Physician:  Azucena Freed :   Primary Care Physician:   Azucena Freed : [facility], [address], [place], [place] [address], [place] [phone]  Reason for Consult: Admit Date: 26-May-2014  Chief Complaint: difficulty walking  Reason for Consult: difficulty walking   History of Present Illness: History of Present Illness:   81 yo LHD M presents to [facility] secondary to having difficulty walking.  Pt notes that sometimes when he stands up, he will feel dizzy and then will fall.  Pt denies vertigo, diplopia, weakness, numbness or swallowing issues.  He reports an occasional tremor but none regular.  He walks at home with a walker but has no problems once he gets up from a sitting position.  He does report having a foggy feeling when he initially gets up.  ROS:  General denies complaints    HEENT no complaints    Lungs no complaints    Cardiac no complaints    GI no complaints    GU no complaints    Musculoskeletal no complaints    Extremities no complaints    Skin no complaints    Neuro no complaints    Endocrine no complaints    Psych no complaints    Past Medical/Surgical Hx:  GERD: per pt/family; on PPI  syncope:   chronic kidney diease:   Hyperlipidemia:   Atrial Fibrillation:   Hypertension:   glaucoma:   indigestion:   rt knee surgery:   double hernia:   cholecystectomy:   Past Medical/ Surgical Hx:  Past Medical History personally reviewed by me as above   Past Surgical History personally reviewed by me as above   Home Medications: Medication Instructions Last Modified Date/Time  colchicine 0.6 mg oral tablet 1  orally once a day 11-Jan-16 01:25  busPIRone 10 mg oral tablet 1 tab(s) orally 2 times a day 11-Jan-16 01:25  metoprolol tartrate 25 mg oral tablet 3 tab(s) orally every 12 hours 11-Jan-16 01:25  diltiazem 240 mg/24 hours oral capsule, extended release 1 cap(s) orally once a day 11-Jan-16 01:25  warfarin 1 mg oral  tablet 5 tab(s) orally once a day restart 03/03/13 ( hold today )  11-Jan-16 01:25  Lasix 20 mg oral tablet 1 tab(s) orally once a day 11-Jan-16 01:25  donepezil 10 mg oral tablet 1 tab(s) orally once a day (in the morning). 11-Jan-16 01:25  sertraline 25 mg oral tablet 1 tab(s) orally once a day (in the morning). 11-Jan-16 01:25  pravastatin 40 mg oral tablet 1 tab(s) orally once a day (in the morning) 11-Jan-16 01:25  allopurinol 100 mg oral tablet 1 tab(s) orally once a day (in the morning). 11-Jan-16 01:25  pantoprazole 40 mg oral delayed release tablet 1 tab(s) orally once a day 11-Jan-16 01:25  latanoprost ophthalmic 0.005% ophthalmic solution 1 drop(s) to each affected eye once a day (at bedtime) 11-Jan-16 01:25   Allergies:  No Known Allergies:   Allergies:  Allergies NKDA    Social/Family History: Employment Status: retired  Lives With: alone  Living Arrangements: house  Social History: no tob, no EtOH, no illicits  Family History: no seizures, no strokes   Vital Signs: **Vital Signs.:   11-Jan-16 12:38  Vital Signs Type Routine  Temperature Temperature (F) 98.1  Celsius 36.7  Temperature Source oral  Pulse Pulse 76  Respirations Respirations 18  Systolic BP Systolic BP 604  Diastolic BP (mmHg) Diastolic BP (mmHg) 91  Mean BP 540  Systolic BP Systolic BP 981  Diastolic  BP (mmHg) Diastolic BP (mmHg) 91  Systolic BP Systolic BP 702  Diastolic BP (mmHg) Diastolic BP (mmHg) 64  Systolic BP Systolic BP 637  Diastolic BP (mmHg) Diastolic BP (mmHg) 74  Pulse Ox % Pulse Ox % 97  Pulse Ox Activity Level  At rest  Oxygen Delivery Room Air/ 21 %   Physical Exam: General: nl weight, NAD  HEENT: normocephalic, sclera nonicteric, oropharynx clear  Neck: supple, no JVD, no bruits  Chest: CTA B, no wheezing  Cardiac: RRR, no murmurs, no edema, 2+ pulses  Extremities: no C/C/E, FROM , R knee replacement   Neurologic Exam: Mental Status: alert and oriented x 3, normal  speech and language, follows complex commands  Cranial Nerves: PERRLA, EOMI, nl VF, face symmetric, tongue midline, shoulder shrug equal  Motor Exam: 5/5 B normal, tone, no tremor  Deep Tendon Reflexes: 1+/4 B, plantars downgoing B, no Hoffman  Sensory Exam: pinprick, temperature, and vibration intact B  Coordination: F to N WNL, HTS WNL  Gait: nl stride length, slightly decreased arms swing, normal posture   Lab Results:  Thyroid:  10-Jan-16 17:51   Thyroid Stimulating Hormone 3.20 (0.45-4.50 (IU = International Unit)  ----------------------- Pregnant patients have  different reference  ranges for TSH:  - - - - - - - - - -  Pregnant, first trimetser:  0.36 - 2.50 uIU/mL)  LabObservation:  11-Jan-16 11:20   OBSERVATION Reason for Test  Hepatic:  10-Jan-16 17:51   Bilirubin, Total 0.8  Alkaline Phosphatase 107 (46-116 NOTE: New Reference Range 12/04/13)  SGPT (ALT) 33 (14-63 NOTE: New Reference Range 12/04/13)  SGOT (AST) 34  Total Protein, Serum 7.5  Albumin, Serum 4.0  Routine Chem:  10-Jan-16 17:51   Glucose, Serum  107  BUN  28  Creatinine (comp)  1.86  Sodium, Serum 141  Potassium, Serum 4.4  Chloride, Serum 107  CO2, Serum 28  Calcium (Total), Serum 9.4  Osmolality (calc) 287  eGFR (African American)  44  eGFR (Non-African American)  37 (eGFR values <44m/min/1.73 m2 may be an indication of chronic kidney disease (CKD). Calculated eGFR, using the MRDR Study equation, is useful in  patients with stable renal function. The eGFR calculation will not be reliable in acutely ill patients when serum creatinine is changing rapidly. It is not useful in patients on dialysis. The eGFR calculation may not be applicable to patients at the low and high extremes of body sizes, pregnant women, and vegetarians.)  Anion Gap  6    21:09   Result Comment URINE DIPSTICK - Less than 10 ml of urine received.  - Interpret microscopic results with caution.  Result(s) reported  on 26 May 2014 at 09:45PM.  Cardiac:  11-Jan-16 04:58   Troponin I < 0.02 (0.00-0.05 0.05 ng/mL or less: NEGATIVE  Repeat testing in 3-6 hrs  if clinically indicated. >0.05 ng/mL: POTENTIAL  MYOCARDIAL INJURY. Repeat  testing in 3-6 hrs if  clinically indicated. NOTE: An increase or decrease  of 30% or more on serial  testing suggests a  clinically important change)  Routine UA:  10-Jan-16 21:09   Color (UA) YELLOW  Clarity (UA) CLEAR  Glucose (UA) NEGATIVE  Bilirubin (UA) NEGATIVE  Ketones (UA) NEGATIVE  Specific Gravity (UA) 1.015  Blood (UA) NEGATIVE  pH (UA) 6.0  Protein (UA) NEGATIVE  Nitrite (UA) NEGATIVE  Leukocyte Esterase (UA) 3+  RBC (UA) NONE SEEN  WBC (UA) 1 /HPF  Bacteria (UA) NONE SEEN  Epithelial Cells (UA) 3 /  HPF  Mucous (UA) PRESENT  Hyaline Cast (UA) 17 /LPF (Result(s) reported on 26 May 2014 at 09:45PM.)  Routine Coag:  11-Jan-16 01:40   Prothrombin  20.2  INR 1.8 (INR reference interval applies to patients on anticoagulant therapy. A single INR therapeutic range for coumarins is not optimal for all indications; however, the suggested range for most indications is 2.0 - 3.0. Exceptions to the INR Reference Range may include: Prosthetic heart valves, acute myocardial infarction, prevention of myocardial infarction, and combinations of aspirin and anticoagulant. The need for a higher or lower target INR must be assessed individually. Reference: The Pharmacology and Management of the Vitamin K  antagonists: the seventh ACCP Conference on Antithrombotic and Thrombolytic Therapy. YWVPX.1062 Sept:126 (3suppl): [id number]. A HCT value >55% may artifactually increase the PT.  In one study,  the increase was an average of 25%. Reference:  "Effect on Routine and Special Coagulation Testing Values of Citrate Anticoagulant Adjustment in Patients with High HCT Values." American Journal of Clinical Pathology 2006;126:400-405.)  Routine Hem:  10-Jan-16 17:51    WBC (CBC) 5.2  RBC (CBC) 5.52  Hemoglobin (CBC) 16.5  Hematocrit (CBC) 51.3  Platelet Count (CBC)  129  MCV 93  MCH 29.9  MCHC 32.1  RDW  16.4  Neutrophil % 63.7  Lymphocyte % 21.0  Monocyte % 9.1  Eosinophil % 5.0  Basophil % 1.2  Neutrophil # 3.3  Lymphocyte # 1.1  Monocyte # 0.5  Eosinophil # 0.3  Basophil # 0.1 (Result(s) reported on 26 May 2014 at 06:14PM.)   Radiology Results: [place]:    11-Jan-16 12:33, US Carotid Doppler Bilateral  US Carotid Doppler Bilateral   REASON FOR EXAM:    hx of b/l carotid artery stenosis  COMMENTS:       PROCEDURE: [place]  - US CAROTID DOPPLER BILATERAL  - May 27 2014 12:33PM     CLINICAL DATA:  81 year old male with a history of known bilateral  carotid artery stenoses    EXAM:  BILATERAL CAROTID DUPLEX ULTRASOUND    TECHNIQUE:  Pearline Cables scale imaging, color Doppler and duplex ultrasound were  performed of bilateral carotid and vertebral arteries in the neck.  COMPARISON:  Prior duplex carotid ultrasound 06/17/2012    FINDINGS:  Criteria: Quantification of carotid stenosis is based on velocity  parameters that correlate the residual internal carotid diameter  with NASCET-based stenosis levels, using the diameter of the distal  internal carotid lumen as the denominator for stenosis measurement.    The following velocity measurements were obtained:    RIGHT    ICA:  190/61 cm/sec    CCA:  69/48 cm/sec  SYSTOLIC ICA/CCA RATIO:  2.5    DIASTOLIC ICA/CCA RATIO:  2.5    ECA:  81 cm/sec    LEFT    ICA:  179/77 cm/sec    CCA:  54/6 cm/sec    SYSTOLIC ICA/CCA RATIO:  2.7    DIASTOLIC ICA/CCA RATIO:  8.5  ECA:  82 cm/sec    RIGHT CAROTID ARTERY: Heterogeneous atherosclerotic plaque beginning  in the carotid bulb extending into the proximal internal carotid  artery. By peak systolicvelocity criteria, there is an estimated  50- 69% diameter stenosis in the proximal to mid internal carotid  artery. Subjectively, this has not  significantly changed compared to  February of 2014.    RIGHT VERTEBRAL ARTERY:  Patent with antegrade flow.    LEFT CAROTID ARTERY: Heterogeneous atherosclerotic plaque beginning  in the carotid bulb and extending into the  proximal internal carotid  artery. By peak systolic velocity criteria, there is an estimated  50- 69% diameter stenosis. Subjectively, this is not significantly  progressed compared to prior. In fact, the estimated stenosis  measures less than seen on the prior study.    LEFT VERTEBRAL ARTERY:  Pain with normal antegrade flow.     IMPRESSION:  1. Moderate (50- 69%) diameter stenosis in the proximal to mid right  internal carotid artery. Subjectively, no significant interval  progression compared to 06/17/2012.  2. Moderate (50- 69%) diameter stenosis in the proximal left  internal carotid artery. Subjectively, no significant interval  progression compared to June 17, 2012.  3. Vertebral arteries are patent with normal antegrade flow.  Signed,  Criselda Peaches, MD    [facility]    [facility]      Electronically Signed   By: Jacqulynn Cadet M.D.    On: 05/27/2014 12:44         Verified By: Criselda Peaches, M.D.,  MRI:    11-Jan-16 10:58, MRI Brain Without Contrast  MRI Brain Without Contrast   REASON FOR EXAM:    falls  COMMENTS:       PROCEDURE: MR  - MR BRAIN WO CONTRAST  - May 27 2014 10:58AM     CLINICAL DATA:  Repeated falling over the last week. Dizziness upon  standing.    EXAM:  MRI HEAD WITHOUT CONTRAST    TECHNIQUE:  Multiplanar,multiecho pulse sequences of the brain and surrounding  structures were obtained without intravenous contrast.  COMPARISON:  Head CT 05/26/2013.  MRI 01/08/2006.    FINDINGS:  Diffusion imaging does not show any acute or subacute infarction.  There are mild chronic small-vessel ischemic changes affecting the  pons. No focal cerebellar insult.  The cerebral hemispheres show mild  chronic small-vessel changes of the white matter. No cortical or  large vessel territory infarction. No mass lesion, hemorrhage,  hydrocephalus or extra-axial collection. No fluid in the sinuses,  middle ears or mastoids. No skull or skullbase lesion. Major vessels  at the base of the brain show flow.     IMPRESSION:  No acute or significant finding. No cause of the presenting symptoms  is identified. Mild brain atrophy and minimal chronic small vessel  change, less than often seen in healthy individuals of this age.      Electronically Signed    By: Nelson Chimes M.D.    On: 05/27/2014 11:03         Verified By: Jules Schick, M.D.,  CT:    10-Jan-16 20:57, CT Head Without Contrast  CT Head Without Contrast   REASON FOR EXAM:    dizzy  COMMENTS:       PROCEDURE: CT  - CT HEAD WITHOUT CONTRAST  - May 26 2014  8:57PM     CLINICAL DATA:  Initial evaluation for recent dizziness and  right-sided ear pain which began 1 week ago    EXAM:  CT HEAD WITHOUT CONTRAST    TECHNIQUE:  Contiguous axial images were obtained from the base of the skull  through the vertex without intravenous contrast.  COMPARISON:  06/17/2012    FINDINGS:  Moderate diffuse atrophy. Normal attenuation without evidence of  mass or infarct. No hemorrhage or extra-axial fluid. No  hydrocephalus. Calvarium intact. No significant inflammatory change  in the visualized portions of the paranasal sinuses. There is  intracranial internal carotid artery calcification bilaterally.  Minimal right mastoid  air cell opacification. This is minimally more  pronounced when compared to the prior study.     IMPRESSION:  Very mild mastoid air cell opacification on the right. Otherwise no  acute intracranial abnormalities.  Electronically Signed    By: Skipper Cliche M.D.    On: 05/26/2014 21:02         Verified By: Rachael Fee, M.D.,   Radiology Impression: Radiology  Impression: MRI of brain personally reviewed by me and shows trace white matter changes    MRA shows severe stenosis of R ICA with possible steal phenomena   Impression/Recommendations: Recommendations:   prior notes reviewed by me reviewed by me   Severe R carotid stenosis-  this maybe causing a steal like phenomena which could cause pt to feel dizzy;  even if this is not the case, we should consider repair before complete occlusion Dizziness-  this sounds central in origing and could be from 1., vitamin deficiency or hypotension L carotid stenosis-  moderate, asymptomatic recommend vascular surgery consult agree with addition of ASA 75m daily start statin for goal LDL < 70 check B12/folate, TSH needs therapy will follow  Electronic Signatures: SJamison Neighbor(MD)  (Signed 11-Jan-16 18:58)  Authored: REFERRING PHYSICIAN, Primary Care Physician, Consult, History of Present Illness, Review of Systems, PAST MEDICAL/SURGICAL HISTORY, HOME MEDICATIONS, ALLERGIES, Social/Family History, NURSING VITAL SIGNS, Physical Exam-, LAB RESULTS, RADIOLOGY RESULTS, Recommendations   Last Updated: 11-Jan-16 18:58 by SJamison Neighbor(MD)

## 2014-09-15 NOTE — Op Note (Signed)
PATIENT NAME:  Daryl Kemp, Daryl Kemp MR#:  [id number] DATE OF BIRTH:  Nov 21, 1923  DATE OF PROCEDURE:  05/29/2014  PREOPERATIVE DIAGNOSES:  1.  Drop attacks.  2.  Intracranial stenosis with basilar artery disease.  3.  Critical stenosis of the left internal carotid artery at the carotid artery bifurcation.   POSTOPERATIVE DIAGNOSES:  1.  Drop attacks.  2.  Intracranial stenosis with basilar artery disease.  3.  Critical stenosis of the left internal carotid artery at the carotid artery bifurcation.   PROCEDURES PERFORMED:   1.  Arch aortogram.  2.  Selective injection of the left common carotid both cervical and cerebral views.  3.  Selective injection of the right common carotid both cervical and cerebral views.  4.  Selective injection of the right subclavian for imaging of the vertebral artery.   SURGEON: Levora Dredge, MD.    SEDATION: Versed 2 mg plus fentanyl 100 mcg administered IV. Continuous ECG, pulse oximetry, and cardiopulmonary monitoring were performed throughout the entire procedure by the interventional radiology nurse. Total sedation time was approximately 1 hour.   ACCESS: A 5 French sheath right common femoral artery.   FLUOROSCOPY TIME:  12 minutes.   CONTRAST USED: Isovue 75 mL.   INDICATIONS: Daryl Kemp is an 81 year old gentleman who has been experiencing drop attacks and upon workup was found to have multilevel disease both in the left carotid distribution as well as in the basilar artery distribution on the right, he also has significant left vertebral disease with the right vertebral artery contributing to the posterior circulation almost exclusively. Also noted on the MRA was a high suspicion for intracranial disease in the distal internal carotid artery. Given this multilevel disease in multiple different distributions angiography is being performed to more optimally plan treatment. Risks and benefits were reviewed with the patient as well as his son. All questions have  been answered. The patient agrees to proceed.   DESCRIPTION OF PROCEDURE: The patient is taken to special procedures and placed in the supine position. After adequate sedation is achieved he is positioned supine. Right groin is prepped and draped in sterile fashion. Ultrasound is placed in a sterile sleeve. Ultrasound is utilized secondary to lack of appropriate landmarks and to avoid vascular injury. Under real-time visualization common femoral artery is identified, it is echolucent and compressible indicating patency. Image is recorded for the permanent record. Under real-time visualization a microneedle is inserted, microwire followed by micro sheath, J-wire followed by a 5 French sheath and 5 French pigtail catheter.   A pigtail catheter is then advanced into the descending aorta and an LAO projection of the arch is obtained.   A stiff angled Glidewire and H1 catheter are then used to select first the right common carotid artery, where anterior views of the cervical and cerebral vessels are performed and then lateral views. The H1 catheter and ultimately a JB1 catheter were used to select the subclavian, however direct accessing or wire selection of the vertebral was not achieved and therefore imaging was done with the catheter at the base of the vertebral in the subclavian. Again anterior and lateral projections were obtained.   JB1 catheter was then used to select the left common carotid and AP, lateral, and cervical, and cerebral views were obtained. After review of the images the patient's catheter was removed over the wire. Oblique view of the right groin was obtained and a StarClose device was deployed. There were no immediate complications.   INTERPRETATION: The arch is  opacified with a bolus injection of contrast. The origins of the great vessels are all widely patent. Of note anatomic abnormality, the left vertebral originates from the aortic arch.   The right common carotid artery is imaged  and is patent as is the bulb. The internal carotid artery demonstrates approximately 40%-50% calcified narrowing at its origin, it is widely patent throughout its distal course, and the intracranial views demonstrate injection of contrast on the right fill the right middle cerebral as well as both anterior cerebrals quite briskly.   The imaging of the right vertebral demonstrates the right vertebral itself is widely patent, filling the posterior cerebral and cerebellar arteries bilaterally. There does appear to be a mid basilar lesion of approximately 50%. There does not appear to be contribution of the left vertebral.  The left common carotid is patent. The left bulb is patent as is the external. The internal demonstrates a string sign, greater than 99% narrowing with very slow flow within the internal, in fact there appears to be some hanging up of contrast in the siphon and perhaps even some retrograde flow, suggesting that the filling from the right is backfilling the distal left carotid siphon. There is marked pruning of the middle cerebral on the right with injection from the left side. There does however not appear to be any significant narrowing of the distal internal carotid artery through the carotid siphon.   SUMMARY: Multilevel cervical and cerebral disease as described above, pattern suggests that the left common carotid artery is the primary issue at this time and should be addressed. He is likely a better candidate for carotid endarterectomy than carotid stenting, although anatomically he does not have any prohibitive situation for stenting if that were needed.    ____________________________ Renford DillsGregory G. Arhianna Ebey, MD ggs:bu D: 05/29/2014 17:47:03 ET T: 05/29/2014 18:10:11 ET JOB#: [id number]  cc: Renford DillsGregory G. Lakina Mcintire, MD, <Dictator> Lamar BlinksBruce J. Kowalski, MD Jillene Bucksenny C. Arlana Pouchate, MD Renford DillsGREGORY G Jondavid Schreier MD ELECTRONICALLY SIGNED 06/19/2014 17:42

## 2014-09-15 NOTE — Discharge Summary (Signed)
PATIENT NAME:  Daryl PetersKECK, Daryl H MR#:  [id number] DATE OF BIRTH:  [birth year]  DATE OF ADMISSION:  05/28/2014 DATE OF DISCHARGE:  06/04/2014  ADMITTING PHYSICIAN: Jonnie KindEdavally Reddy, MD  DISCHARGING PHYSICIAN: Enid Baasadhika Cinda Hara, MD  PRIMARY CARE PHYSICIAN: Dewaine Oatsenny Tate, MD  CONSULTATIONS IN THE HOSPITAL: 1.  Cardiology by Dr. Gwen PoundsKowalski.  2.  Neurology by Dr. Mellody DrownMatthew Smith.  3.  Vascular by Dr. Gilda CreaseSchnier.  4.  Palliative care by Dr. Harriett SineNancy Phifer.  For more details, please look at the discharge summary dictated by Dr. Nemiah CommanderKalisetti on June 02, 2014.   DISCHARGE DIAGNOSES: Have remained the same.   DISCHARGE HOME MEDICATIONS:  1.   Donepezil 10 mg p.o. daily.  2.  Sertraline 25 mg p.o. daily.  3.  Pravastatin 40 mg p.o. daily.  4.  Allopurinol 100 mg p.o. daily.  5.  Protonix 40 mg p.o. daily.  6.  Lasix 20 mg p.o. daily.  7.  Latanoprost 0.005% ophthalmic solution 1 drop each eye once a day at bedtime.  8.  Buspirone 10 mg p.o. b.i.d.  9.  Colchicine 0.6 mg p.o. daily.  10.  Cardizem 240 mg p.o. daily.  11.  Metoprolol 75 mg p.o. b.i.d.  12.  Coumadin 5 mg p.o. daily.  13.  Aspirin 81 mg p.o. daily.  14.  Meclizine 12.5 mg q. 8 hours p.r.n. for dizziness.  15.  Cyanocobalamin 1000 mcg p.o. daily.  16.  Norco 5/325 mg 1 tablet q. 8 hours p.r.n. for moderate pain.  17.  Keflex 250 mg p.o. q. 8 hours for 2 more days.  18.  Albuterol nebulizer 3 mL q. 6 hours p.r.n. for wheezing or shortness of breath.   DISCHARGE HOME OXYGEN: 1 liter.   DISCHARGE DIET: Low-sodium, low-fat diet.   DISCHARGE ACTIVITY: As tolerated.   FOLLOWUP INSTRUCTIONS: 1.  Follow up with PCP in 1 to 2 weeks.  2.  Vascular follow-up with Dr. Gilda CreaseSchnier in 1 to 2 weeks.  3.  Cardiology follow-up in 2 to 3 weeks.  4.  PT-INR check in 3 days.  5.  Physical therapy.  6.  Advised to wean off O2.  7.  Magic cup 3 times a day with meals.   HOSPITAL COURSE AND LABS: As noted in the last discharge summary on June 02, 2014.  However, the patient was significantly weak to go back home with home health so physical therapy reevaluation was done and they have recommended rehab, so [facility].   This is an addendum to the prior discharge summary.   CODE STATUS: DO NOT RESUSCITATE.   TIME SPENT ON DISCHARGE: 45 minutes.   ____________________________ Enid Baasadhika Donnia Poplaski, MD rk:sb D: 06/04/2014 11:28:15 ET T: 06/04/2014 11:41:53 ET JOB#: [id number]  cc: Enid Baasadhika Sharlena Kristensen, MD, <Dictator> Jillene Bucksenny C. Arlana Pouchate, MD Renford DillsGregory G. Schnier, MD Lamar BlinksBruce J. Kowalski, MD Enid BaasADHIKA Delia Slatten MD ELECTRONICALLY SIGNED 06/06/2014 10:57

## 2014-09-15 NOTE — Op Note (Signed)
PATIENT NAME:  Daryl Kemp, Daryl Kemp MR#:  [id number] DATE OF BIRTH:  03/14/1924  DATE OF PROCEDURE:  05/31/2014  PREOPERATIVE DIAGNOSES: 1.  Symptomatic left internal carotid artery stenosis.  2.  Stenosis of the basilar artery.  3.  Vertebral basilar symptoms with episodes consistent with drop attacks.  POSTOPERATIVE DIAGNOSES:  1.  Symptomatic left internal carotid artery stenosis.  2.  Stenosis of the basilar artery.  3.  Vertebral basilar symptoms with episodes consistent with drop attacks.  PROCEDURE PERFORMED: Left carotid endarterectomy with primary closure.   SURGEON: Renford DillsGregory G. Priya Matsen, M.D.   ANESTHESIA: General by endotracheal intubation.   FLUIDS: Per anesthesia record.   ESTIMATED BLOOD LOSS: 250 mL.   SPECIMEN: Plaque to pathology for permanent section.   INDICATIONS: [name] is an 81 year old gentleman who presented to the hospital with episodes sounding very much like drop attacks. Subsequently, work-up included MRI angiography which demonstrated both 99% stenosis of the left internal carotid artery as well as a high-grade critical stenosis of the basilar artery. There was also left vertebral artery disease noted. Circle of [place] was noted to be incomplete. Based on this constellation of findings, drop attacks and vertebrobasilar systems are quite consistent, and therefore, the patient has undergone further angiography to better define possible corrective surgeries and procedures. Based on the results of this, it is elected to treat his left common carotid artery as this is the worst lesion, also would provide the most significant improvement in flow to the brain. Risks and benefits were reviewed in detail. All questions have been answered. The patient agrees to proceed.   DESCRIPTION OF PROCEDURE: The patient is taken to the operating room and placed in the supine position. After adequate general anesthesia is induced, appropriate invasive monitors are placed, he is positioned  supine with his neck extended slightly and rotated to the right. Left neck and chest wall are prepped and draped in a sterile fashion.   A curvilinear incision is made along the anterior margin of the sternocleidomastoid muscle and carried down through the soft tissues to expose the platysma. The external jugular vein is ligated with silk ties. The platysma is ligated and divided with Bovie cautery. Sternocleidomastoid muscle is identified. It is reflected posterolaterally and at the level of the omohyoid muscle common carotid artery is identified. Anatomical variation with the vagus nerve in the anterior medial portion of the carotid artery was encountered. This then splayed out almost in a plexus-like fashion directly over the bulb necessitating very meticulous dissection. Ultimately, the internal carotid artery was isolated at the level of the digastric and dissected circumferentially. It should be noted that once the sternocleidomastoid muscle was identified 7000 units of heparin was given, given the high degree of stenosis of his lesion and his symptomatic presentation. External carotid artery was then identified and looped proximally with a silastic vessel loop as was the superior thyroid.   A vessel loop was then passed around the vagus nerve, which was then retracted more medially in a gentle fashion.   The common followed by the external followed the internal carotid artery was clamped, arteriotomy was made and extended with Potts scissors, and an indwelling Sundt shunt was placed without difficulty. Flow was re-established to the brain.   Endarterectomy was then performed under direct visualization, in the distal common bulb and internal carotid artery. External carotid artery was treated with the eversion technique. A total of six interrupted 7-0 sutures were used to tack the distal intimal edge within the  internal carotid artery and five 6-0 Prolene sutures were used to tack the proximal edge in  the common carotid artery. Because of the large bulbous nature of the artery, primary closure was performed using two running 6-0 Prolene sutures. Flushing maneuvers were performed, the bulb was copiously irrigated, shunt was removed and the cervix suture line was completed.   The wound as well as the suture line was then checked for hemostasis. The wound was then irrigated with approximately 150 mL of sterile saline and then Surgicel and Evicel was placed in and around the bed of the carotid artery. The platysma was then reapproximated with running 3-0 Vicryl and the skin was closed with 4-0 Monocryl subcuticular.   The patient will be awakened in the operating room moving all extremities and obeying simple commands and will be taken to the recovery area.  ____________________________ Renford Dills, MD ggs:sb D: 05/31/2014 10:37:39 ET T: 05/31/2014 11:56:43 ET JOB#: [id number]  cc: Renford Dills, MD, <Dictator> Lamar Blinks, MD Jillene Bucks. Arlana Pouch, MD Renford Dills MD ELECTRONICALLY SIGNED 06/19/2014 17:42

## 2014-09-15 NOTE — Consult Note (Signed)
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  He notes that sometimes when he stands up, he will feel dizzy and then will fall. He describes symptoms consistent with drop attacks. Pt denies vertigo, diplopia, weakness, numbness or swallowing issues.  He reports an occasional tremor but none regular.  He walks at home with a walker but has no problems once he gets up from a sitting position.  He does report having a foggy feeling when he initially gets up.  He denies focal motor changes and no episodes of AF.  PAST MEDICAL HISTORY:  1.  Hypertension.  2.  Chronic atrial fibrillation, on chronic anticoagulation.  3.  Hyperlipidemia.  4.  Cardiac stent.  5.  CKD, stage II.  6.  Gastroesophageal reflux disease.  7.  Glaucoma.  8.  Bilateral carotid artery disease.  9.  Gout.  10.  Memory impairment.  11.  Depression.   PAST SURGICAL HISTORY:  1.  Right knee surgery.  2.  Double hernia repair.   3.  Cholecystectomy.   Home Medications: Medication Instructions Status  aspirin 81 mg oral delayed release tablet 1 tab(s) orally once a day Active  warfarin 1 mg oral tablet 5 tab(s) orally once a day  Active  donepezil 10 mg oral tablet 1 tab(s) orally once a day (at bedtime) Active  colchicine 0.6 mg oral tablet 1  orally once a day Active  busPIRone 10 mg oral tablet 1 tab(s) orally 2 times a day Active  metoprolol tartrate 25 mg oral tablet 3 tab(s) orally every 12 hours Active  diltiazem 240 mg/24 hours oral capsule, extended release 1 cap(s) orally once a day Active  Lasix 20 mg oral tablet 1 tab(s) orally once a day Active  pantoprazole 40 mg oral delayed release tablet 1 tab(s) orally once a day Active  donepezil 10 mg oral tablet 1 tab(s) orally once a day (in the morning). Active  sertraline 25 mg oral tablet 1 tab(s) orally once a day (in the  morning). Active  pravastatin 40 mg oral tablet 1 tab(s) orally once a day (in the morning) Active  allopurinol 100 mg oral tablet 1 tab(s) orally once a day (in the morning). Active  latanoprost ophthalmic 0.005% ophthalmic solution 1 drop(s) to each affected eye once a day (at bedtime) Active    No Known Allergies:   Case History:  Family History Non-Contributory   Social History negative tobacco, negative ETOH, negative Illicit drugs   Review of Systems:  Fever/Chills No   Cough No   Sputum No   Abdominal Pain No   Diarrhea No   Constipation No   Nausea/Vomiting No   SOB/DOE No   Chest Pain No   Telemetry Reviewed Afib   Dysuria No   Tolerating Diet Yes   Physical Exam:  GEN well developed, no acute distress   HEENT hearing intact to voice, moist oral mucosa   NECK supple  trachea midline   RESP normal resp effort  no use of accessory muscles   CARD irregular rate  No LE edema  no JVD   ABD denies tenderness  soft   EXTR negative cyanosis/clubbing, negative edema   SKIN No rashes, No ulcers, skin turgor poor   NEURO cranial nerves intact, follows commands, motor/sensory function intact   PSYCH alert, good insight   Nursing/Ancillary Notes: **Vital Signs.:  12-Jan-16 05:37  Vital Signs Type Routine  Temperature Temperature (F) 97.5  Celsius 36.3  Temperature Source oral  Pulse Pulse 56  Respirations Respirations 21  Systolic BP Systolic BP 91  Diastolic BP (mmHg) Diastolic BP (mmHg) 59  Mean BP 69  Systolic BP Systolic BP 91  Diastolic BP (mmHg) Diastolic BP (mmHg) 59  Pulse Lying Pulse Lying 56  Systolic BP Systolic BP 93  Diastolic BP (mmHg) Diastolic BP (mmHg) 60  Pulse Pulse Sitting 55  Systolic BP Systolic BP 92  Diastolic BP (mmHg) Diastolic BP (mmHg) 60  Pulse Standing Pulse Standing 62  Pulse Ox % Pulse Ox % 95  Pulse Ox Activity Level  At rest  Oxygen Delivery Room Air/ 21 %   Thyroid:  10-Jan-16 17:51   Thyroid  Stimulating Hormone 3.20 (0.45-4.50 (IU = International Unit)  ----------------------- Pregnant patients have  different reference  ranges for TSH:  - - - - - - - - - -  Pregnant, first trimetser:  0.36 - 2.50 uIU/mL)  11-Jan-16 04:58   Thyroid Stimulating Hormone 3.51 (0.45-4.50 (IU = International Unit)  ----------------------- Pregnant patients have  different reference  ranges for TSH:  - - - - - - - - - -  Pregnant, first trimetser:  0.36 - 2.50 uIU/mL)  LabObservation:  11-Jan-16 11:20   OBSERVATION Reason for Test    16:49   OBSERVATION PACS Image dcm.[id number].[id number]  Hepatic:  10-Jan-16 17:51   Bilirubin, Total 0.8  Alkaline Phosphatase 107 (46-116 NOTE: New Reference Range 12/04/13)  SGPT (ALT) 33 (14-63 NOTE: New Reference Range 12/04/13)  SGOT (AST) 34  Total Protein, Serum 7.5  Albumin, Serum 4.0  General Ref:  10-Jan-16 17:51   Vitamin B12, Serum ========== TEST NAME ==========  ========= RESULTS =========  = REFERENCE RANGE =  VITAMIN B12  Vitamin B12 Vitamin B12                     [   334 pg/mL            ]           211-946               [facility]            No: [id number]           [address], [place], [place] [address]           Lindon Romp, MD         [phone]   Result(s) reported on 28 May 2014 at 06:19AM.  11-Jan-16 04:58   Vitamin B12, Serum ========== TEST NAME ==========  ========= RESULTS =========  = REFERENCE RANGE =  VITAMIN B12  Vitamin B12 Vitamin B12                     [   282 pg/mL            ]           211-946               [facility]            No: [id number]           [address], [place], [place] [address]           Lindon Romp, MD         [phone]   Result(s) reported on 28 May 2014 at 06:19AM.  Cardiology:  10-Jan-16 17:52   Ventricular Rate 79  Atrial Rate 76  QRS Duration 82  QT 406  QTc 465  R Axis 7  T Axis 12  ECG interpretation Atrial  fibrillation Low voltage QRS Nonspecific ST abnormality , probably digitalis effect Abnormal ECG When compared with ECG of 24-Feb-2013 11:58, Vent. rate has decreased BY  40 BPM ST no longer depressed in Lateral leads Nonspecific T waveabnormality, improved in Anterior leads ----------unconfirmed---------- Confirmed by OVERREAD, NOT ([id number]), editor PEARSON, BARBARA ([id number]) on 05/27/2014 1:42:46 PM  11-Jan-16 11:20   Echo Doppler REASON FOR EXAM:     COMMENTS:     PROCEDURE: [facility] - ECHO DOPPLER COMPLETE(TRANSTHOR)  - May 27 2014 11:20AM   RESULT: Echocardiogram Report  Patient Name:   Daryl HICKOX [facility] SR. Date of Exam: 05/27/2014 Medical Rec #:  [id number]               Custom1: Date of Birth:  1923-08-06            Height:       72.0 in Patient Age:    26 years             Weight:       223.0 lb Patient Gender: M                    BSA:          2.23 m??  Indications: Syncope Sonographer:    Sherrie Sport RDCS Referring Phys: Azucena Freed, N  Summary:  1. Left ventricular ejection fraction, by visual estimation, is 60 to  65%.  2. Normal global left ventricular systolic function.  3. Mildly dilated left atrium.  4. Mild to moderate mitral valve regurgitation.  5. Mild aortic regurgitation.  6. Mild to moderate tricuspid regurgitation. 2D AND M-MODE MEASUREMENTS (normal ranges within parentheses): Left Ventricle:          Normal IVSd (2D):      1.20 cm (0.7-1.1) LVPWd (2D):     1.01 cm (0.7-1.1) Aorta/LA:                  Normal LVIDd (2D):     4.30 cm (3.4-5.7) Aortic Root (2D): 3.15 cm (2.4-3.7) LVIDs (2D):     2.92 cm           Left Atrium (2D): 4.10 cm (1.9-4.0) LV FS (2D):     32.0 %   (>25%) LV EF (2D):     60.5 %   (>50%)                            Right Ventricle:                                   RVd (2D):        0.26 cm LV DIASTOLIC FUNCTION: MV Peak E: 0.77 m/s E/e' Ratio: 7.40                     Decel Time: 100 msec SPECTRAL DOPPLER ANALYSIS (where applicable): Mitral  Valve: MV P1/2 Time: 29.00 msec MV Area, PHT: 7.59 cm?? Aortic Valve: AoV Max Vel: 0.79 m/s AoV Peak PG: 2.5 mmHg AoV Mean PG: LVOT Vmax: 0.58 m/s LVOT VTI:  LVOT Diameter: 2.00 cm AoV Area, Vmax: 2.32 cm?? AoV Area, VTI:  AoV Area, Vmn:  Tricuspid Valve and PA/RV Systolic Pressure: TR Max Velocity: 2.68 m/s RA  Pressure: 5 mmHg RVSP/PASP: 33.8 mmHg Pulmonic Valve: PV Max Velocity: 0.93 m/s PV Max PG: 3.5 mmHg PV Mean PG:  PHYSICIAN INTERPRETATION: Left Ventricle: The left ventricular internal cavity size was normal. LV  septal wall thickness was normal. LV posterior wall thickness was normal.  Global LV systolic function was normal. Left ventricular ejection  fraction, by visual estimation, is 60 to 65%. Left Atrium:The left atrium is mildly dilated. Right Atrium: The right atrium is normal in size. Mitral Valve: The mitral valve is not well seen. Mild to moderate mitral  valve regurgitation is seen. Tricuspid Valve: The tricuspid valve is not well seen. Mild to moderate   tricuspid regurgitation is visualized. The tricuspid regurgitant velocity  is 2.68 m/s, and with an assumed right atrial pressure of 5 mmHg, the  estimated right ventricular systolic pressure is normal at 33.8 mmHg. Aortic Valve: The aortic valve is tricuspid. Mild aortic valve  regurgitation is seen.  Ulmer MD Electronically signed by [id number] Bartholome Bill MD Signature Date/Time: 05/27/2014/1:56:11 PM  *** Final ***  IMPRESSION: .   Verified By: Teodoro Spray, M.D., MD  Routine Chem:  10-Jan-16 17:51   Glucose, Serum  107  BUN  28  Creatinine (comp)  1.86  Sodium, Serum 141  Potassium, Serum 4.4  Chloride, Serum 107  CO2, Serum 28  Calcium (Total), Serum 9.4  Osmolality (calc) 287  eGFR (African American)  44  eGFR (Non-African American)  37 (eGFR values <67m/min/1.73 m2 may be an indication of chronic kidney disease (CKD). Calculated eGFR, using the MRDR Study equation, is useful in   patients with stable renal function. The eGFR calculation will not be reliable in acutely ill patients when serum creatinine is changing rapidly. It is not useful in patients on dialysis. The eGFR calculation may not be applicable to patients at the low and high extremes of body sizes, pregnant women, and vegetarians.)  Anion Gap  6    21:09   Result Comment URINE DIPSTICK - Less than 10 ml of urine received.  - Interpret microscopic results with caution.  Result(s) reported on 26 May 2014 at 09:45PM.  [id number]  Folic Acid, Serum 8.4 (Result(s) reported on 27 May 2014 at 03:34PM.)  Cardiac:  10-Jan-16 17:51   Troponin I < 0.02 (0.00-0.05 0.05 ng/mL or less: NEGATIVE  Repeat testing in 3-6 hrs  if clinically indicated. >0.05 ng/mL: POTENTIAL  MYOCARDIAL INJURY. Repeat  testing in 3-6 hrs if  clinically indicated. NOTE: An increase or decrease  of 30% or more on serial  testing suggests a  clinically important change)  11-Jan-16 01:40   Troponin I < 0.02 (0.00-0.05 0.05 ng/mL or less: NEGATIVE  Repeat testing in 3-6 hrs  if clinically indicated. >0.05 ng/mL: POTENTIAL  MYOCARDIAL INJURY. Repeat  testing in 3-6 hrs if  clinically indicated. NOTE: An increase or decrease  of 30% or more on serial  testing suggests a  clinically important change)    04:58   Troponin I < 0.02 (0.00-0.05 0.05 ng/mL or less: NEGATIVE  Repeat testing in 3-6 hrs  if clinically indicated. >0.05 ng/mL: POTENTIAL  MYOCARDIAL INJURY. Repeat  testing in 3-6 hrs if  clinically indicated. NOTE: An increase or decrease  of 30% or more on serial  testing suggests a  clinically important change)  Routine UA:  10-Jan-16 21:09   Color (UA) YELLOW  Clarity (UA) CLEAR  Glucose (UA) NEGATIVE  Bilirubin (UA) NEGATIVE  Ketones (UA) NEGATIVE  Specific Gravity (UA) 1.015  Blood (UA) NEGATIVE  pH (UA) 6.0  Protein (UA) NEGATIVE  Nitrite (UA) NEGATIVE  Leukocyte Esterase (UA) 3+  RBC (UA)  NONE SEEN  WBC (UA) 1 /HPF  Bacteria (UA) NONE SEEN  Epithelial Cells (UA) 3 /HPF  Mucous (UA) PRESENT  Hyaline Cast (UA) 17 /LPF (Result(s) reported on 26 May 2014 at 09:45PM.)  Routine Coag:  11-Jan-16 01:40   Prothrombin  20.2  INR 1.8 (INR reference interval applies to patients on anticoagulant therapy. A single INR therapeutic range for coumarins is not optimal for all indications; however, the suggested range for most indications is 2.0 - 3.0. Exceptions to the INR Reference Range may include: Prosthetic heart valves, acute myocardial infarction, prevention of myocardial infarction, and combinations of aspirin and anticoagulant. The need for a higher or lower target INR must be assessed individually. Reference: The Pharmacology and Management of the Vitamin K  antagonists: the seventh ACCP Conference on Antithrombotic and Thrombolytic Therapy. BLTJQ.3009 Sept:126 (3suppl): [id number]. A HCT value >55% may artifactually increase the PT.  In one study,  the increase was an average of 25%. Reference:  "Effect on Routine and Special Coagulation Testing Values of Citrate Anticoagulant Adjustment in Patients with High HCT Values." American Journal of Clinical Pathology 2006;126:400-405.)  Routine Hem:  10-Jan-16 17:51   WBC (CBC) 5.2  RBC (CBC) 5.52  Hemoglobin (CBC) 16.5  Hematocrit (CBC) 51.3  Platelet Count (CBC)  129  MCV 93  MCH 29.9  MCHC 32.1  RDW  16.4  Neutrophil % 63.7  Lymphocyte % 21.0  Monocyte % 9.1  Eosinophil % 5.0  Basophil % 1.2  Neutrophil # 3.3  Lymphocyte # 1.1  Monocyte # 0.5  Eosinophil # 0.3  Basophil # 0.1 (Result(s) reported on 26 May 2014 at 06:14PM.)   [place]:    11-Jan-16 12:33, US Carotid Doppler Bilateral  US Carotid Doppler Bilateral   REASON FOR EXAM:    hx of b/l carotid artery stenosis  COMMENTS:       PROCEDURE: [place]  - US CAROTID DOPPLER BILATERAL  - May 27 2014 12:33PM     CLINICAL DATA:  81 year old male with a history of known  bilateral  carotid artery stenoses    EXAM:  BILATERAL CAROTID DUPLEX ULTRASOUND    TECHNIQUE:  Pearline Cables scale imaging, color Doppler and duplex ultrasound were  performed of bilateral carotid and vertebral arteries in the neck.  COMPARISON:  Prior duplex carotid ultrasound 06/17/2012    FINDINGS:  Criteria: Quantification of carotid stenosis is based on velocity  parameters that correlate the residual internal carotid diameter  with NASCET-based stenosis levels, using the diameter of the distal  internal carotid lumen as the denominator for stenosis measurement.    The following velocity measurements were obtained:    RIGHT    ICA:  190/61 cm/sec    CCA:  23/30 cm/sec  SYSTOLIC ICA/CCA RATIO:  2.5    DIASTOLIC ICA/CCA RATIO:  2.5    ECA:  81 cm/sec    LEFT    ICA:  179/77 cm/sec    CCA:  07/6 cm/sec    SYSTOLIC ICA/CCA RATIO:  2.7    DIASTOLIC ICA/CCA RATIO:  8.5  ECA:  82 cm/sec    RIGHT CAROTID ARTERY: Heterogeneous atherosclerotic plaque beginning  in the carotid bulb extending into the proximal internal carotid  artery. By peak systolicvelocity criteria, there is an estimated  50- 69% diameter stenosis in the proximal to mid internal carotid  artery. Subjectively, this has not significantly changed compared to  February of 2014.    RIGHT VERTEBRAL ARTERY:  Patent with antegrade flow.    LEFT CAROTID ARTERY: Heterogeneous atherosclerotic plaque beginning  in the carotid bulb and extending into the proximal internal carotid  artery. By peak systolic velocity criteria, there is an estimated  50- 69% diameter stenosis. Subjectively, this is not significantly  progressed compared to prior. In fact, the estimated stenosis  measures less than seen on the prior study.    LEFT VERTEBRAL ARTERY:  Pain with normal antegrade flow.     IMPRESSION:  1. Moderate (50- 69%) diameter stenosis in the proximal to mid right  internal carotid artery. Subjectively, no  significant interval  progression compared to 06/17/2012.  2. Moderate (50- 69%) diameter stenosis in the proximal left  internal carotid artery. Subjectively, no significant interval  progression compared to June 17, 2012.  3. Vertebral arteries are patent with normal antegrade flow.  Signed,  Criselda Peaches, MD    [facility]    [facility]      Electronically Signed   By: Jacqulynn Cadet M.D.    On: 05/27/2014 12:44         Verified By: Criselda Peaches, M.D.,  MRI:    11-Jan-16 16:49, MRI Brain W Contrast  MRI Brain W Contrast   REASON FOR EXAM:    dizziness; DO POST CONTRAST IMAGING ONLY PER RAD  COMMENTS:       PROCEDURE: MR  - MR BRAIN WITH CONTRAST  - May 27 2014  4:49PM     CLINICAL DATA:  Repeated falls over the past week upon standing.  Dizziness.    EXAM:  MRI HEAD WITH CONTRAST AND MRA HEAD WITH CONTRAST AND MRI NECK  WITHOUT AND WITH CONTRAST    TECHNIQUE:  Multiplanar, multiecho pulse sequences of the brain and surrounding  structures were obtained with intravenous contrast. Angiographic  images of the head were obtained using MRA technique with contrast.  Multiplanar, multiecho pulse sequences of the neck and surrounding  structures were obtained without and with intravenous contrast.    CONTRAST:  MultiHance 10 mL.    COMPARISON:  MR head without contrast earlier today.    FINDINGS:  MRI HEAD FINDINGS    Post infusion, no abnormal enhancement of the brain or meninges.    MRA HEAD FINDINGS  There is mild non stenotic irregularity of the cavernous and  supraclinoid ICA on the RIGHT.    There is markedly reduced flow related enhancement in the upper  cervical, petrous, and cavernous segments of the LEFT internal  carotid artery. LEFT ICA supraclinoid segment is patent but small.  There appears to be a severe stenosis at the LEFT ICA terminus.    RIGHT MCA appears normal. Minor  irregularity of the distal RIGHT A1  ACA.    There is diminished caliber and flow related enhancement in the LEFT  anterior circulation. Moderately diseased anterior communicating  artery. LEFT ACA appears severely diseased and the majority of the  contribution from the collateral flow to the LEFT MCA appears to be  via the basilar via the LEFT PCOM.    Basilar artery is widely patent but somewhat hypoplastic. There may  be a focal stenosis between the superior cerebellar artery origins  and the LEFT PCA origin. RIGHT PCA arises directly from the carotid.  Both vertebrals  contribute to formation of the basilar. There is no  LEFT PCA disease. No definite cerebellar branch occlusion. No  visible intracranial aneurysm    MRI NECK FINDINGS    Respiratory motion contributes to poor visualization of the proximal  great vessels. There is conventional branching. Difficult to  evaluate for proximal disease of the RIGHT common carotid, LEFT  common carotid, or RIGHT subclavian. There is a suspected 50%  stenosis at the origin of the LEFT subclavian.    RIGHT carotid bifurcation demonstrates moderate posterior wall  plaque, estimated 50-75% RIGHT ICA stenosis. This is borderline flow  reducing. Posterior wall plaque extends into the cervical ICA over a  length of 2-3 cm above which the caliber of the RIGHT ICA is normal.    On the LEFT, there is a severely tapering ICA stenosis beginning 1.5  cm above its origin. There is a short segment where there is no  visibleflow related enhancement. This could represent a focal  dissection or severe atheromatous disease. Estimated degree of  stenosis is 90% or greater. The caliber of the cervical ICA on the  LEFT is diminished but the vessel is patent to the siphon. Findings  are consistent with a severe flow-limiting stenosis and developing  string sign.  The LEFT vertebral appears moderately diseased in its midportion  above which it displays a  normal caliber. RIGHT vertebral is widely  patent through the neck. Evaluation of ostial disease is impaired by  poor signal at the vertebral origins.     IMPRESSION:  Post infusion, no abnormal enhancement of brain is observed.    Critical stenosis of the LEFT internal carotid artery in its  proximal segment. This is related either to focal dissection or  severe atheromatous disease. Estimated stenosis is 90% or greater,  with developing string sign. Patency of the LEFT ICA is established  through the neck on contrast enhanced MRA.    Collateral flow to the LEFT MCA territory appears to be primarily  from the basilar via the LEFT PCOM. The patient may be exhibiting a  steal phenomenon with dizziness upon standing due to this collateral  flow from the posterior circulation.    Estimated 50-75% stenosis of the proximal RIGHT ICA in the neck.    Moderately diseased anterior communicating artery and LEFT A1 ACA.  Fetal origin LEFT PCA. Slight hypoplasia of the vertebrobasilar  system.    Findings discussed with ordering provider.      Electronically Signed    By: Rolla Flatten M.D.    On: 05/27/2014 17:39         Verified By: Staci Righter, M.D.,    Impression 1.  Drop Attacks  given the findings on MRI I believe that the patient is symptomatic with respect to his complicated cerebral vascular disease.  Given the possibility of distal ICA stenois in association with the proximal 99% lesion I feel that selective angiography is warrranted to plan for possible reconstruction.  Ultimately I feel that a left CEA will be needed and once accomplished we can reassess his improvement.  I do not think his Basilar stenosis is treatable.  However, I would defer this to a neuro interventionalist.  The risks and benefits to angiography was discussed with the patient and his son.  All questions were answered.  He agrees to proceed with angiography. 2.  History of atrial fibrillation with  controlled ventricular rate. Patient on chronic anticoagulation. The patient is stable. Continue the same.  3.  History of coronary stent, stable clinically. The patient is on Plavix. No cardiac symptoms. Check cardiac enzymes because of questionable syncope. Continue home medications and check echocardiogram.  4.  Hypertension, controlled on home medications. Continue the same.  5.  Hyperlipidemia on statin. Continue the same.  6.  Chronic kidney disease, stable clinically, creatinine 1.8. The patient is stable. Monitor.  7.  History of gout. No acute problems. Continue home medications.  8.  History of mild dementia, stable on home medications. Continue the same.  9.  History of depression, stable on home medications. Continue the same.  10.  History of glaucoma, stable, on ophthalmic drops. Continue the same.   Plan level 4 consult.   Electronic Signatures: Daryl Kemp (MD)  (Signed 12-Jan-16 21:51)  Authored: General Aspect/Present Illness, Home Medications, Allergies, History and Physical Exam, Vital Signs, Labs, Radiology, Impression/Plan   Last Updated: 12-Jan-16 21:51 by Daryl Kemp (MD)

## 2014-09-15 NOTE — Consult Note (Signed)
PATIENT NAME:  Daryl Kemp, Daryl Kemp MR#:  [id number] DATE OF BIRTH:  [birth year]  DATE OF CONSULTATION:  05/30/2014  REQUESTING PHYSICIAN:  Levora DredgeGregory Schnier, MD   CONSULTING PHYSICIAN:  Lamar BlinksBruce J. Burdette Gergely, MD  REASON FOR CONSULTATION:  Syncope with chronic kidney disease, atrial fibrillation, hypertension, coronary artery disease.   CHIEF COMPLAINT:  "I've been passing out."   HISTORY OF PRESENT ILLNESS:  This is an 81 year old male with known chronic kidney disease Stage III, stable on appropriate medications, and chronic atrial fibrillation with good heart rate control and no current evidence of sick sinus syndrome and-or significant heart block previously on anticoagulation, but anticoagulation was discontinued due to concerns of fall risk. There has been no bleeding or bruising at this time. The patient also has essential hypertension, well-controlled in the appropriate range. He does have a cause of his syncopal episode including significant carotid atherosclerosis, causing poor cerebral flow, which is likely causing his issues. Currently he has had known coronary artery disease, but currently no evidence of angina and-or congestive heart failure with a normal troponin.  He also has had a recent echocardiogram showing normal LV systolic function, ejection fraction of 60% with mild to moderate mitral and tricuspid regurgitation, stable from before.  He has had known lipid treatment with an LDL of 90, HDL of 52. This has been stable over time as well. The patient has currently had no further symptoms or complications of angiogram and will need to proceed to surgical intervention.  The remaining review of systems negative for vision change, ringing in the ears, hearing loss, cough, congestion, heartburn, nausea, vomiting, diarrhea, bloody stools, stomach pain, extremity pain, leg weakness, cramping of the buttocks, known blood clots, headaches, nosebleeds, congestion, trouble swallowing, frequent urination,  urination at night, muscle weakness, numbness, anxiety, depression, skin lesions or skin rashes.   PAST MEDICAL HISTORY:  1.  Chronic kidney disease Stage III. 2.  Chronic atrial fibrillation, non-valvular.  3.  Essential hypertension.  4.  Peripheral vascular disease with carotid atherosclerosis.  5.  Coronary atherosclerosis of native vessels status post previous stent.   FAMILY HISTORY: No family members with early onset of cardiovascular disease or hypertension.   SOCIAL HISTORY: Currently denies alcohol or tobacco use.   ALLERGIES: As listed.   MEDICATIONS: As listed.   PHYSICAL EXAMINATION:  VITAL SIGNS: Blood pressure is 110/68 bilaterally. Heart rate is 62, upright, reclining, and irregular.  GENERAL: He is a well appearing male in no acute distress.  HEENT: No icterus, thyromegaly, ulcers, hemorrhage, or xanthelasma.  CARDIOVASCULAR: Irregularly irregular with normal S1 and S2, distant heart sounds. No apparent murmur, gallop, or rub.  PMI is diffuse. Carotid upstroke normal with bruit. Jugular venous pressure is normal.  LUNGS: Have few basilar crackles and a few expiratory wheezes.  ABDOMEN: Soft, nontender. Cannot assess hepatosplenomegaly or masses due to an increased abdominal girth.  EXTREMITIES: 2+ radial, femoral, trace dorsal pedal pulses, with trace lower extremity edema. No cyanosis, clubbing or ulcers.  NEUROLOGIC: He is oriented to time, place, and person, with normal mood and affect.   ASSESSMENT: An 81 year old male with recurrent episodes of syncope secondary to peripheral vascular disease and carotid atherosclerosis with chronic non-valvular atrial fibrillation, chronic kidney disease Stage III, essential hypertension, coronary artery disease without angina, needing further treatment options.   RECOMMENDATIONS:  1.  Continue to abstain from anticoagulation due to concerns of surgical intervention although the patient does understand the risks of possible stroke  with atrial fibrillation without anticoagulation during  surgical intervention.  2.  Proceed to surgery without restriction for carotid atherosclerosis causing syncopal episodes due to the lowest risk possible at this time, due to stability of atrial fibrillation, chronic kidney disease, coronary artery disease and no current evidence of angina and-or congestive heart failure.  Would proceed to rehabilitation without restriction as well.  3.  Reinstatement of warfarin for risk reduction in stroke with atrial fibrillation when able after surgery to reduce the risk of bleeding complications during surgery.  4.  No further cardiac diagnostics necessary at this time with normal ejection fraction of 60% and normal heart function.  5.  Continue heart rate and blood pressure control with chronic non-valvular atrial fibrillation and essential hypertension.  6.  Continue lipid management for vascular disease with high intensity cholesterol therapy.  7.  Follow postoperatively for any other significant complications.     ____________________________ Lamar Blinks, MD bjk:DT D: 05/30/2014 08:07:00 ET T: 05/30/2014 08:27:26 ET JOB#: [id number]  cc: Lamar Blinks, MD, <Dictator> Lamar Blinks MD ELECTRONICALLY SIGNED 06/03/2014 13:27

## 2014-09-15 NOTE — Discharge Summary (Signed)
PATIENT NAME:  Daryl Kemp, ORVIS MR#:  [id number] DATE OF BIRTH:  [birth year]  DATE OF ADMISSION:  05/28/2014 DATE OF DISCHARGE:  06/02/2014  ADMITTING PHYSICIAN: Crissie Figures, MD   DISCHARGING PHYSICIAN: Enid Baas, MD   PRIMARY CARE PHYSICIAN: Jillene Bucks. Arlana Pouch, MD  CONSULTATIONS IN THE HOSPITAL:  1. Cardiology consultation by Dr. Gwen Pounds.  2. Neurology consultation with Dr. Mellody Drown. 3. Vascular consultation by Dr. Gilda Crease.  4.  Palliative care consultation with Dr. Harriett Sine Phifer.   DISCHARGE DIAGNOSES:  1. Critical left internal carotid artery stenosis status post left carotid endarterectomy.  2. Ataxia and dizziness related to carotid stenosis.  3. Chronic atrial fibrillation on Coumadin. 4. CKD stage 3.  5. Hypertension.  6. Hyperlipidemia.  7. Urinary tract infection.  8. Atelectasis and hypoxia postop.   DISCHARGE HOME MEDICATIONS:  1. Donepezil 10 mg p.o. daily.  2. Sertraline 25 mg p.o. in the morning.  3. Pravastatin 40 mg p.o. daily.  4. Allopurinol 100 mg p.o. daily.  5. Protonix 40 mg p.o. daily.  6. Lasix 20 mg p.o. daily.  7. Latanoprost ophthalmic 0.005% ophthalmic solution, 1 drop each eye once a day at bed time.  8. Buspirone 10 mg p.o. b.i.d.  9. Colchicine 0.6 mg 1 tablet daily.  10. Cardizem 240 mg p.o. daily.  11. Metoprolol 75 mg p.o. b.i.d.  12. Coumadin 5 mg p.o. daily.  13. Aspirin 81 mg p.o. daily.  14. Meclizine 12.5 mg q.8 hours p.r.n. for dizziness.  15. Keflex 250 mg p.o. t.i.d. for 4 more days.  16. Vitamin B12 at 1000 mcg p.o. daily.   DISCHARGE DIET: Low-sodium, low-fat and low-cholesterol diet.   DISCHARGE ACTIVITY: As tolerated.    FOLLOWUP INSTRUCTIONS:  1. PCP followup in 2 weeks.  2. Vascular followup with Dr. Gilda Crease in 2 weeks. 3. Cardiology followup in 2 to 3 weeks.  4. PT and INR check in 3 days.  5. Home health, physical therapy and nursing.   LABORATORY AND IMAGING STUDIES PRIOR TO DISCHARGE: WBC 7.7,  hemoglobin 12.4, hematocrit 39.0, platelet count 105,000.   Sodium 141, potassium 4.1, chloride 108, bicarbonate 26, BUN 40, creatinine 1.9, glucose of 118, and INR is 2.0, calcium of 8.5. Urinalysis with 3+ nitrate, 3+ blood, 2+ leukocyte esterase, 3+ blood, about 400 WBCs, trace bacteria seen.    MRA of the neck done on 05/27/2014, showing critical stenosis of left internal carotid artery and proximal segment. Collateral flow to the left MCA teritory appears to be primarily from basilar artery via the left posterior communicating artery. The patient may be exhibiting steal phenomenon with dizziness upon standing, due to its collateral flow from posterior circulation.  MRA of the brain without contrast  confirmed the same critical stenosis of the left internal carotid artery. MRI of the brain showing no acute infarct noted. Dopplers of the neck showing moderate right internal carotid artery stenosis and moderate left internal carotid artery stenosis. Echo Doppler showing EF of 60% to 65%.   BRIEF HOSPITAL COURSE: Daryl Kemp is an 81 year old very pleasant Caucasian male with past medical history significant for hypertension, chronic atrial fibrillation on Coumadin, hyperlipidemia, CKD stage 2 to 3, and glaucoma, who presents to the hospital secondary to recurrent falls, dizziness, and presyncopal episodes.   1. Ataxia with falls and dizziness, likely related to decreased cerebral circulation from significant left internal carotid artery stenosis and STEAL phenomenon. Seen by a neurologist and also vascular surgeon in the hospital. MRI confirmed the findings. MRI  of the brain did not show any acute infarct, so the patient had a left carotid endarterectomy done by Dr. Gilda CreaseSchnier 05/29/2014. Postoperatively, he has not had any complications. His dizziness has improved and the patient worked with physical therapy, recommended home health, and is being discharged home at this time.  2. Urinary tract infection. He  had a Foley catheter post surgery. His catheter was removed. The patient still has some dysuria. UA reveals that he does have leukocyte esterase bacteria and WBCs.  3. Started on Keflex at this time. Afebrile and normal white count.  4. Chronic atrial fibrillation on Coumadin. Coumadin was temporarily held in the hospital for his surgery. Atrial fibrillation paced rate is controlled. He is on metoprolol and Cardizem and Coumadin is restarted back on 06/01/2014. INR is 2.0 therapeutic. Followup INR check in 3 days by home health nurse been ordered.  5. Chronic kidney disease stage 2 to 3, creatinine at baseline around 1.8 at the time of discharge.  6. Gout. Continue his home medications.  7. His course has been otherwise uneventful in the hospital.   DISCHARGE CONDITION: Stable.   DISCHARGE DISPOSITION: Home with home health.   TIME SPENT ON DISCHARGE: 45 minutes.   CODE STATUS: DO NOT RESUSCITATE.    ____________________________ Enid Baasadhika Toan Mort, MD rk:ap D: 06/02/2014 12:33:00 ET T: 06/02/2014 13:12:50 ET JOB#: [id number]  cc: Renford DillsGregory G. Schnier, MD Jillene Bucksenny C. Arlana Pouchate, MD Lamar BlinksBruce J. Kowalski, MD Enid Baasadhika Yuriy Cui, MD, <Dictator>   Enid BaasADHIKA Joyceann Kruser MD ELECTRONICALLY SIGNED 06/06/2014 11:14

## 2014-09-15 NOTE — H&P (Signed)
PATIENT NAME:  Daryl Kemp, Daryl Kemp MR#:  [id number] DATE OF BIRTH:  Oct 25, 1923  DATE OF ADMISSION:  05/26/2014  REFERRING PHYSICIAN: Sheryl L. Mindi Junker, MD  PRIMARY CARE PHYSICIAN: Jillene Bucks. Arlana Pouch, MD  PRIMARY CARDIOLOGIST: Lamar Blinks, MD  ADMITTING PHYSICIAN: Crissie Figures, MD  CHIEF COMPLAINT:  1.  Recurrent falls.  2.  Feeling funny in the head.   HISTORY OF PRESENT ILLNESS: An 81 year old Caucasian male with past medical history of multiple medical problems, including hypertension, chronic atrial fibrillation on chronic anticoagulation, hyperlipidemia, CKD stage II, gastroesophageal reflux disease, history of syncopal episodes, history of bilateral carotid artery stenosis, history of glaucoma, gout, memory impairment, and depression, presents to the Emergency Room with complaints of recurrent episodes of falls with associated funny feeling in the head for the past few days. The patient states that he was in his usual state of health until about last week. He started having recurrent falls associated with some funny feeling in the head, which happened twice for which he consulted his primary care practitioner and was advised to followup with a vascular surgeon because of the history of bilateral carotid artery stenosis. The patient was waiting for his appointment with a vascular surgeon for evaluation for his bilateral carotid artery disease. Meanwhile, he started having another episode last night, hence came to the Emergency Room for further evaluation. The patient denies any dizziness. No focal weakness or numbness. No speech or swallowing difficulties. No visual impairment. Denies any chest pain. No shortness of breath. No nausea. No vomiting. No diarrhea. No sensation of spinning. No urinary symptoms. In the Emergency Room, the patient was evaluated by the ED physician and was found to have stable vital signs and neurological examination was completely normal. Blood work was completely normal  except for a baseline creatinine of 1.86, which is stable. CT head, noncontrast study, was negative for any acute intracranial pathology. The patient is comfortably resting in the bed at this time and denies any complaints at this time.   PAST MEDICAL HISTORY:  1.  Hypertension.  2.  Chronic atrial fibrillation, on chronic anticoagulation.  3.  Hyperlipidemia.  4.  Cardiac stent.  5.  CKD, stage II.  6.  Gastroesophageal reflux disease.  7.  Glaucoma.  8.  Bilateral carotid artery disease.  9.  Gout.  10.  Memory impairment.  11.  Depression.   PAST SURGICAL HISTORY:  1.  Right knee surgery.  2.  Double hernia repair.   3.  Cholecystectomy.  ALLERGIES: No known drug allergies.   FAMILY HISTORY: Significant for hypertension and heart disease.    SOCIAL HISTORY: He is married, lives with his wife. No history of smoking, alcohol, or substance abuse.    REVIEW OF SYSTEMS:  CONSTITUTIONAL: Negative for fever, fatigue, or generalized weakness.  EYES: Negative for blurred vision or double vision. No pain. No redness. No inflammation. History of glaucoma--uses ophthalmic drops.  EARS, NOSE, AND THROAT: Negative for tinnitus, ear pain, hearing loss, epistaxis, nasal discharge, or difficulty swallowing.  RESPIRATORY: Negative for cough, wheezing, dyspnea, hemoptysis, or painful respiration.  CARDIOVASCULAR: Negative for chest pain, palpitations, dizziness, syncopal episodes, orthopnea, dyspnea on exertion, or pedal edema.  GASTROINTESTINAL: Negative for nausea, vomiting, diarrhea, constipation, abdominal pain, hematemesis, melena, rectal bleeding, or GERD symptoms.  GENITOURINARY: Negative for dysuria, frequency, urgency, or hematuria.  ENDOCRINE: Negative for polyuria, nocturia, heat or cold intolerance.  HEMATOLOGIC/LYMPHATIC: Negative for anemia, easy bruising, or bleeding.  INTEGUMENTARY: Negative for acne, skin rash, or lesions.  MUSCULOSKELETAL: History of gout, stable on home  medications. Currently denies any arthritis or joint pain.   NEUROLOGICAL: Negative for focal weakness or numbness. No history of CVA, TIA, or seizure disorder. History of memory loss, for which he takes medications and is stable. As noted earlier, he has a funny feeling in the head, but denies any focal weakness.  PSYCHIATRIC: History of depression, takes medication and it is under control.    PHYSICAL EXAMINATION:  VITAL SIGNS: Temperature 97.8 degrees Fahrenheit, pulse rate 76 per minute, respirations 18 per minute, blood pressure 150/85, O2 saturation 96% on room air.  GENERAL: Well-nourished, well developed, alert and oriented, in no acute distress, comfortably resting in the bed.  HEAD: Atraumatic, normocephalic.   EYES: Pupils are equal, react to light and accommodation. No conjunctival pallor. No scleral icterus. Extraocular movements intact.  NOSE: No drainage. No lesions.  EARS: No drainage. No external lesions.  ORAL CAVITY: No mucosal lesions. No exudates.  NECK: Supple. No JVD. No thyromegaly. No carotid bruits. Range of motion of neck within normal limits.  RESPIRATORY: Good respiratory effort. Not using accessory muscles of respiration. Bilateral vesicular breath sounds. No rales or rhonchi.  CARDIOVASCULAR: S1, S2 regular. No murmurs, gallops, or clicks appreciated. Peripheral pulses equal at carotid, femoral, and pedal pulses. No peripheral edema.  GASTROINTESTINAL: Abdomen is soft, nontender. No hepatosplenomegaly. No masses. No rigidity. No guarding. Bowel sounds present and equal in all 4 quadrants.  GENITOURINARY: Deferred.  MUSCULOSKELETAL: No joint tenderness or effusion. Range of motion adequate. Strength and tone within normal limits  SKIN: Inspection within normal limits. No obvious wounds.  LYMPHATIC: No cervical lymphadenopathy.  VASCULAR: Good dorsalis pedis and posterior tibial pulses.  NEUROLOGICAL: Alert, awake, and oriented x 3. Cranial nerves II to XII grossly  intact. No sensory deficit. Motor: Strength 5/5 in both upper and lower extremities. DTRs 2+ bilaterally and symmetric. Plantars downgoing.  PSYCHIATRIC: Alert, awake, and oriented x 3. Judgment and insight adequate. Memory and mood within normal limits.   LABORATORY DATA: Serum glucose 107, BUN 28, creatinine 1.86, sodium 141, potassium 4.4, chloride 107, bicarbonate 28, total calcium 9.4, total protein 7.5, albumin 4.0, total bilirubin 0.8, alkaline phosphatase 107, AST 34, ALT 33. Troponin less than 0.02. TSH 3.2. WBC 5.2, hemoglobin 16.5, hematocrit 51.2, platelet count 129,000. Prothrombin time 20.2, INR 1.8. Urinalysis unremarkable.   IMAGING STUDY: CT of the head, noncontrast study: Very mild mastoid air cell opacification on the right; otherwise, no acute intracranial abnormalities.   EKG: Atrial fibrillation with ventricular rate of 79 beats per minute. No acute ST-T changes.   ASSESSMENT AND PLAN: An 81 year old Caucasian male with a past medical history of multiple medical problems including hypertension, chronic atrial fibrillation on Coumadin, hyperlipidemia, chronic kidney disease stage II, history of coronary stent, gastroesophageal reflux disease, glaucoma, gout, bilateral carotid artery stenosis, memory impairment, depression,  presents with complaints of recurrent falls, presyncopal episodes.  1.  Recurrent falls concerning for transient ischemic attack versus cerebrovascular accident and history of presyncopal episodes, query etiology. History of bilateral carotid artery disease, rule out posterior circulation cerebrovascular accident. Plan: Admit to telemetry for observation.  Neurologic checks. Continue warfarin. Carotid Dopplers, MRI brain, echocardiography requested. Neurology consultation requested and physical therapy consult requested.  2.  History of atrial fibrillation with controlled ventricular rate. Patient on chronic anticoagulation. The patient is stable. Continue the  same.  3.  History of coronary stent, stable clinically. The patient is on Plavix. No cardiac symptoms. Check cardiac enzymes  because of questionable syncope. Continue home medications and check echocardiogram.  4.  Hypertension, controlled on home medications. Continue the same.  5.  Hyperlipidemia on statin. Continue the same.  6.  Chronic kidney disease, stable clinically, creatinine 1.8. The patient is stable. Monitor.  7.  History of gout. No acute problems. Continue home medications.  8.  History of mild dementia, stable on home medications. Continue the same.  9.  History of depression, stable on home medications. Continue the same.  10.  History of glaucoma, stable, on ophthalmic drops. Continue the same.  11.  Deep vein thrombosis prophylaxis: Coumadin.  12.  Gastrointestinal prophylaxis: Proton pump inhibitor.   CODE STATUS: FULL CODE.    TIME SPENT: 55 minutes.    ____________________________ Crissie FiguresEdavally N. Braedyn Kauk, MD enr:ts D: 05/27/2014 02:32:30 ET T: 05/27/2014 03:16:27 ET JOB#: [id number]  cc: Crissie FiguresEdavally N. Kelisha Dall, MD, <Dictator> Jillene Bucksenny C. Arlana Pouchate, MD Crissie FiguresEDAVALLY N Suhaylah Wampole MD ELECTRONICALLY SIGNED 05/27/2014 19:12

## 2015-09-09 ENCOUNTER — Encounter: Payer: Self-pay | Admitting: Cardiovascular Disease

## 2015-09-09 ENCOUNTER — Ambulatory Visit (INDEPENDENT_AMBULATORY_CARE_PROVIDER_SITE_OTHER): Payer: Medicare HMO | Admitting: Cardiovascular Disease

## 2015-09-09 VITALS — BP 120/80 | HR 92 | Ht 71.0 in | Wt 229.0 lb

## 2015-09-09 DIAGNOSIS — I4891 Unspecified atrial fibrillation: Secondary | ICD-10-CM

## 2015-09-09 DIAGNOSIS — I482 Chronic atrial fibrillation, unspecified: Secondary | ICD-10-CM | POA: Insufficient documentation

## 2015-09-09 DIAGNOSIS — R0602 Shortness of breath: Secondary | ICD-10-CM | POA: Diagnosis not present

## 2015-09-09 DIAGNOSIS — I25119 Atherosclerotic heart disease of native coronary artery with unspecified angina pectoris: Secondary | ICD-10-CM | POA: Insufficient documentation

## 2015-09-09 MED ORDER — METOPROLOL TARTRATE 25 MG PO TABS: 25.0000 mg | ORAL_TABLET | Freq: Two times a day (BID) | ORAL | Status: DC

## 2015-09-09 NOTE — Patient Instructions (Signed)
Medication Instructions:  Your physician has recommended you make the following change in your medication:  INCREASE metoprolol to  twice daily   Labwork: BMET, CBC, BNP today  Testing/Procedures: Your physician has requested that you have an echocardiogram. Echocardiography is a painless test that uses sound waves to create images of your heart. It provides your doctor with information about the size and shape of your heart and how well your heart's chambers and valves are working. This procedure takes approximately one hour. There are no restrictions for this procedure.  Your physician has recommended that you wear a holter monitor. Holter monitors are medical devices that record the heart's electrical activity. Doctors most often use these monitors to diagnose arrhythmias. Arrhythmias are problems with the speed or rhythm of the heartbeat. The monitor is a small, portable device. You can wear one while you do your normal daily activities. This is usually used to diagnose what is causing palpitations/syncope (passing out).    Follow-Up: Your physician recommends that you schedule a follow-up appointment with Dr. Kirke Corin after tests are complete   Any Other Special Instructions Will Be Listed Below (If Applicable).     If you need a refill on your cardiac medications before your next appointment, please call your pharmacy.  Echocardiogram An echocardiogram, or echocardiography, uses sound waves (ultrasound) to produce an image of your heart. The echocardiogram is simple, painless, obtained within a short period of time, and offers valuable information to your health care provider. The images from an echocardiogram can provide information such as:  Evidence of coronary artery disease (CAD).  Heart size.  Heart muscle function.  Heart valve function.  Aneurysm detection.  Evidence of a past heart attack.  Fluid buildup around the heart.  Heart muscle thickening.  Assess  heart valve function. LET [facility] CARE PROVIDER KNOW ABOUT:  Any allergies you have.  All medicines you are taking, including vitamins, herbs, eye drops, creams, and over-the-counter medicines.  Previous problems you or members of your family have had with the use of anesthetics.  Any blood disorders you have.  Previous surgeries you have had.  Medical conditions you have.  Possibility of pregnancy, if this applies. BEFORE THE PROCEDURE  No special preparation is needed. Eat and drink normally.  PROCEDURE   In order to produce an image of your heart, gel will be applied to your chest and a wand-like tool (transducer) will be moved over your chest. The gel will help transmit the sound waves from the transducer. The sound waves will harmlessly bounce off your heart to allow the heart images to be captured in real-time motion. These images will then be recorded.  You may need an IV to receive a medicine that improves the quality of the pictures. AFTER THE PROCEDURE You may return to your normal schedule including diet, activities, and medicines, unless your health care provider tells you otherwise.   This information is not intended to replace advice given to you by your health care provider. Make sure you discuss any questions you have with your health care provider.   Document Released: 04/30/2000 Document Revised: 05/24/2014 Document Reviewed: 01/08/2013 Elsevier Interactive Patient Education 2016 Elsevier Inc. Holter Monitoring A Holter monitor is a small device that is used to detect abnormal heart rhythms. It clips to your clothing and is connected by wires to flat, sticky disks (electrodes) that attach to your chest. It is worn continuously for 24-48 hours. HOME CARE INSTRUCTIONS  Wear your Holter monitor at all  times, even while exercising and sleeping, for as long as directed by your health care provider.  Make sure that the Holter monitor is safely clipped to your  clothing or close to your body as recommended by your health care provider.  Do not get the monitor or wires wet.  Do not put body lotion or moisturizer on your chest.  Keep your skin clean.  Keep a diary of your daily activities, such as walking and doing chores. If you feel that your heartbeat is abnormal or that your heart is fluttering or skipping a beat:  Record what you are doing when it happens.  Record what time of day the symptoms occur.  Return your Holter monitor as directed by your health care provider.  Keep all follow-up visits as directed by your health care provider. This is important. SEEK IMMEDIATE MEDICAL CARE IF:  You feel lightheaded or you faint.  You have trouble breathing.  You feel pain in your chest, upper arm, or jaw.  You feel sick to your stomach and your skin is pale, cool, or damp.  You heartbeat feels unusual or abnormal.   This information is not intended to replace advice given to you by your health care provider. Make sure you discuss any questions you have with your health care provider.   Document Released: 01/30/2004 Document Revised: 05/24/2014 Document Reviewed: 12/10/2013 Elsevier Interactive Patient Education [employer] Else[employer] Inc[employer]

## 2015-09-09 NOTE — Progress Notes (Signed)
Cardiology Office Note   Date:  09/09/2015   ID:  Daryl Peters Sr., DOB [birth year], MRN [id number]  PCP:  Jaclyn Shaggy, MD  Cardiologist:   Lorine Bears, MD   Chief Complaint  Patient presents with  . other    C/o sob and rapid heart beat/afib. Meds reviewed verbally with pt.      History of Present Illness: Daryl DESROCHES Sr. is a 82 y.o. male who presents To establish cardiovascular care. He is switching from Dr. Gwen Pounds. He has known history of chronic atrial fibrillation treated with rate control and anticoagulation with warfarin. He had this for many years. There is history of coronary artery disease with previous 1 vessel stenting around 2012. He does have known history of carotid disease status post left carotid endarterectomy, chronic kidney disease and gout. He is a previous smoker and quit many years ago. There is no family history of coronary artery disease. He has mild dementia but continues to be functional overall and lives by himself. He is accompanied by his daughter.  He reports significant exertional dyspnea with minimal activities which has progressed over the years to the point of not able to do basic activities of daily living. He reports no improvement in the past with prior PCI. He denies orthopnea, PND or significant leg edema. He does take furosemide 5 days a week.  He has been having hard time keeping appointment for his INR checked given his increased limitations with driving.   Past Medical History  Diagnosis Date  . Gout   . A-fib (HCC)   . Hypertension   . Hyperlipidemia   . Carotid artery occlusion   . Coronary artery disease     Past Surgical History  Procedure Laterality Date  . Knee surgery Right   . Cardiac catheterization      [facility] x1 Stent  . Carotid endarterectomy       Current Outpatient Prescriptions  Medication Sig Dispense Refill  . allopurinol (ZYLOPRIM) 100 MG tablet Take 100 mg by mouth daily.     . busPIRone (BUSPAR) 10 MG  tablet Take 10 mg by mouth 2 (two) times daily.     Marland Kitchen donepezil (ARICEPT) 10 MG tablet Take 10 mg by mouth as directed.     . furosemide (LASIX) 20 MG tablet Take 20 mg by mouth. Takes 1 tablet qd except for Thursday and Sunday.    . latanoprost (XALATAN) 0.005 % ophthalmic solution Place 1 drop into both eyes at bedtime.    . metoprolol tartrate (LOPRESSOR) 25 MG tablet Take 12.5 mg by mouth 2 (two) times daily.     . pantoprazole (PROTONIX) 40 MG tablet Take 40 mg by mouth daily.     . pravastatin (PRAVACHOL) 40 MG tablet Take 40 mg by mouth daily.     . sertraline (ZOLOFT) 25 MG tablet Take 25 mg by mouth daily.     Marland Kitchen warfarin (COUMADIN) 1 MG tablet Take 0.5 mg by mouth as directed.    . warfarin (COUMADIN) 5 MG tablet Take 5 mg by mouth as directed.     No current facility-administered medications for this visit.    Allergies:   Review of patient's allergies indicates no known allergies.    Social History:  The patient  reports that he has quit smoking. His smoking use included Cigarettes and Cigars. He quit after 38 years of use. He does not have any smokeless tobacco history on file. He reports that he does  not drink alcohol or use illicit drugs.   Family History:  The patient's Negative for premature coronary artery disease   ROS:  Please see the history of present illness.   Otherwise, review of systems are positive for none.   All other systems are reviewed and negative.    PHYSICAL EXAM: VS:  BP 120/80 mmHg  Pulse 92  Ht 5\' 11"  (1.803 m)  Wt 229 lb (103.874 kg)  BMI 31.95 kg/m2 , BMI Body mass index is 31.95 kg/(m^2). GEN: Well nourished, well developed, in no acute distress HEENT: normal Neck: no JVD, carotid bruits, or masses. Scar from left CEA Cardiac: Irregularly irregular; no murmurs, rubs, or gallops,no edema  Respiratory:  clear to auscultation bilaterally, normal work of breathing GI: soft, nontender, nondistended, + BS MS: no deformity or atrophy Skin: warm  and dry, no rash Neuro:  Strength and sensation are intact Psych: euthymic mood, full affect   EKG:  EKG is ordered today. The ekg ordered today demonstrates atrial fibrillation with ventricular rate of 92 bpm with nonspecific ST and T wave changes.   Recent Labs: No results found for requested labs within last 365 days.    Lipid Panel No results found for: CHOL, TRIG, HDL, CHOLHDL, VLDL, LDLCALC, LDLDIRECT    Wt Readings from Last 3 Encounters:  09/09/15 229 lb (103.874 kg)  06/11/13 218 lb (98.884 kg)        ASSESSMENT AND PLAN:  1.  Chronic atrial fibrillation: It is highly possible that his ventricular rate is not well controlled accounting for some of his symptoms of significant exertional dyspnea. Thus, I elected to increase the dose of metoprolol to 25 mg twice daily and I ordered a 24-hour Holter monitor to evaluate his average heart rate. He continues to be on long-term anticoagulation with warfarin but he is interested in switching to a NOAC. I ordered routine labs on him to check his kidney function and CBC.  2. Severe exertional dyspnea: The most likely reason for this is likely diastolic heart failure related to suboptimal rate control for atrial fibrillation. I requested BNP and echocardiogram. He might require higher dose of furosemide.  3. Coronary artery disease involving native coronary arteries: Significant exertional dyspnea could be angina equivalent. If there is no improvement in symptoms with adjusting his medications, further ischemic cardiac evaluation will be pursued.  4. Hyperlipidemia: Continue treatment with pravastatin with a target LDL of less than 70.  5. Dementia: This appears to be mild overall and he continues to be functional.    Disposition:   FU with me in 1 month  Signed,  Lorine BearsMuhammad Elyshia Kumagai, MD  09/09/2015 3:06 PM    [facility]

## 2015-09-10 LAB — BASIC METABOLIC PANEL
BUN/Creatinine Ratio: 14 (ref 10–24)
BUN: 27 mg/dL (ref 8–27)
CALCIUM: 9.3 mg/dL (ref 8.6–10.2)
CO2: 22 mmol/L (ref 18–29)
CREATININE: 1.99 mg/dL — AB (ref 0.76–1.27)
Chloride: 103 mmol/L (ref 96–106)
GFR calc Af Amer: 33 mL/min/{1.73_m2} — ABNORMAL LOW (ref >59)
GFR, EST NON AFRICAN AMERICAN: 29 mL/min/{1.73_m2} — AB (ref >59)
GLUCOSE: 101 mg/dL — AB (ref 65–99)
Potassium: 4.5 mmol/L (ref 3.5–5.2)
SODIUM: 143 mmol/L (ref 134–144)

## 2015-09-10 LAB — CBC
HEMATOCRIT: 48.2 % (ref 37.5–51.0)
HEMOGLOBIN: 16.2 g/dL (ref 12.6–17.7)
MCH: 28.9 pg (ref 26.6–33.0)
MCHC: 33.6 g/dL (ref 31.5–35.7)
MCV: 86 fL (ref 79–97)
Platelets: 150 10*3/uL (ref 150–379)
RBC: 5.61 x10E6/uL (ref 4.14–5.80)
RDW: 16.8 % — ABNORMAL HIGH (ref 12.3–15.4)
WBC: 5.1 10*3/uL (ref 3.4–10.8)

## 2015-09-10 LAB — BRAIN NATRIURETIC PEPTIDE: BNP: 260.7 pg/mL — ABNORMAL HIGH (ref 0.0–100.0)

## 2015-09-26 ENCOUNTER — Ambulatory Visit (INDEPENDENT_AMBULATORY_CARE_PROVIDER_SITE_OTHER): Payer: Medicare HMO

## 2015-09-26 ENCOUNTER — Other Ambulatory Visit: Payer: Self-pay

## 2015-09-26 DIAGNOSIS — I4891 Unspecified atrial fibrillation: Secondary | ICD-10-CM

## 2015-09-26 DIAGNOSIS — R0602 Shortness of breath: Secondary | ICD-10-CM

## 2015-09-29 ENCOUNTER — Telehealth: Payer: Self-pay

## 2015-09-29 ENCOUNTER — Other Ambulatory Visit: Payer: Self-pay

## 2015-09-29 DIAGNOSIS — I509 Heart failure, unspecified: Secondary | ICD-10-CM

## 2015-09-29 MED ORDER — APIXABAN 2.5 MG PO TABS: 2.5000 mg | ORAL_TABLET | Freq: Two times a day (BID) | ORAL | Status: DC

## 2015-09-29 MED ORDER — FUROSEMIDE 40 MG PO TABS: 40.0000 mg | ORAL_TABLET | Freq: Every day | ORAL | Status: DC

## 2015-09-29 NOTE — Telephone Encounter (Signed)
Spoke w/ pt's daughter and his granddaughter.  Advised them of Dr. Jari SportsmanArida's recommendation.  Advised them that I am leaving samples at the front desk for her to p/u at her convenience.  They are appreciative and will call back w/ any other questions or concerns.

## 2015-09-29 NOTE — Telephone Encounter (Signed)
Stop Warfarin.  Start Eliquis 2.5 mg bid.

## 2015-09-29 NOTE — Telephone Encounter (Signed)
Spoke w/ pt's daughter.  She states that she would for pt to come off of coumadin, as the frequent trips for checking are becoming too burdensome for him at his age.  She has spoken w/ Dr. Jari SportsmanArida's nurse Jasmine DecemberSharon regarding other possible meds, and would like to know if Dr. Kirke CorinArida is agreeable to him trying Eliquis. If so, she would like to p/u samples until mail order meds will arrive.  Advised her that I will make Dr. Kirke CorinArida aware of her decision and call her back w/ his recommendation.

## 2015-10-01 ENCOUNTER — Other Ambulatory Visit: Payer: Self-pay | Admitting: *Deleted

## 2015-10-01 MED ORDER — FUROSEMIDE 40 MG PO TABS: 40.0000 mg | ORAL_TABLET | Freq: Every day | ORAL | Status: DC

## 2015-10-03 ENCOUNTER — Ambulatory Visit
Admission: RE | Admit: 2015-10-03 | Discharge: 2015-10-03 | Disposition: A | Discharge status: Home / Self Care | Payer: Medicare HMO | Source: Ambulatory Visit | Attending: Cardiovascular Disease | Admitting: Cardiovascular Disease

## 2015-10-03 ENCOUNTER — Telehealth: Payer: Self-pay | Admitting: Cardiovascular Disease

## 2015-10-03 DIAGNOSIS — Z029 Encounter for administrative examinations, unspecified: Secondary | ICD-10-CM | POA: Insufficient documentation

## 2015-10-03 NOTE — Telephone Encounter (Signed)
Spoke to Daryl Kemp with pharmacy and verified that current instructions for furosemide are 40 mg once daily except for Thursday and Sunday. They verbalized understanding and had no further questions.

## 2015-10-03 NOTE — Telephone Encounter (Signed)
Pharmacist calling in regards to furosemide (LASIX) 40 MG tablet. Clarification on directions. Please call

## 2015-10-06 ENCOUNTER — Other Ambulatory Visit (INDEPENDENT_AMBULATORY_CARE_PROVIDER_SITE_OTHER): Payer: Medicare HMO | Admitting: *Deleted

## 2015-10-06 DIAGNOSIS — I509 Heart failure, unspecified: Secondary | ICD-10-CM | POA: Diagnosis not present

## 2015-10-07 ENCOUNTER — Ambulatory Visit: Payer: Self-pay | Admitting: Cardiovascular Disease

## 2015-10-07 ENCOUNTER — Other Ambulatory Visit: Payer: Self-pay

## 2015-10-07 LAB — BASIC METABOLIC PANEL
BUN/Creatinine Ratio: 15 (ref 10–24)
BUN: 36 mg/dL — ABNORMAL HIGH (ref 8–27)
CALCIUM: 9.7 mg/dL (ref 8.6–10.2)
CO2: 22 mmol/L (ref 18–29)
CREATININE: 2.4 mg/dL — AB (ref 0.76–1.27)
Chloride: 101 mmol/L (ref 96–106)
GFR calc Af Amer: 27 mL/min/{1.73_m2} — ABNORMAL LOW (ref >59)
GFR, EST NON AFRICAN AMERICAN: 23 mL/min/{1.73_m2} — AB (ref >59)
GLUCOSE: 99 mg/dL (ref 65–99)
Potassium: 4.3 mmol/L (ref 3.5–5.2)
SODIUM: 145 mmol/L — AB (ref 134–144)

## 2015-10-09 ENCOUNTER — Other Ambulatory Visit: Payer: Self-pay

## 2015-10-09 DIAGNOSIS — I159 Secondary hypertension, unspecified: Secondary | ICD-10-CM

## 2015-10-09 MED ORDER — FUROSEMIDE 40 MG PO TABS: 40.0000 mg | ORAL_TABLET | Freq: Every day | ORAL | Status: DC

## 2015-10-14 ENCOUNTER — Encounter: Payer: Self-pay | Admitting: Cardiovascular Disease

## 2015-10-14 ENCOUNTER — Ambulatory Visit (INDEPENDENT_AMBULATORY_CARE_PROVIDER_SITE_OTHER): Payer: Medicare HMO | Admitting: Cardiovascular Disease

## 2015-10-14 VITALS — BP 86/58 | HR 81 | Ht 70.0 in | Wt 231.4 lb

## 2015-10-14 DIAGNOSIS — I159 Secondary hypertension, unspecified: Secondary | ICD-10-CM

## 2015-10-14 DIAGNOSIS — I482 Chronic atrial fibrillation, unspecified: Secondary | ICD-10-CM

## 2015-10-14 MED ORDER — METOPROLOL TARTRATE 25 MG PO TABS: 25.0000 mg | ORAL_TABLET | Freq: Two times a day (BID) | ORAL | Status: DC

## 2015-10-14 NOTE — Progress Notes (Signed)
Cardiology Office Note   Date:  10/14/2015   ID:  Daryl Peters Sr., DOB 07/02/1923, MRN [id number]  PCP:  Jaclyn Shaggy, MD  Cardiologist:   Lorine Bears, MD   Chief Complaint  Patient presents with  . other    ECHO/Monitor F/U. Patient C/O of SOB. Medications verbally reviewed with patient.       History of Present Illness: Daryl CANTERBURY Sr. is a 82 y.o. male who ss here today for a follow-up visit regarding chronic atrial fibrillation, coronary artery disease and chronic diastolic heart failure.   He has known history of chronic atrial fibrillation treated with rate control and anticoagulation with warfarin. He had this for many years. There is history of coronary artery disease with previous 1 vessel stenting around 2012. He does have known history of carotid disease status post left carotid endarterectomy, chronic kidney disease and gout. He is a previous smoker and quit many years ago. There is no family history of coronary artery disease. He has mild dementia but continues to be functional overall and lives by himself. He is accompanied by his daughter.  He was seen recently for significant exertional dyspnea with minimal activities which has progressed over the years to the point of not able to do basic activities of daily living.  During last visit, I increased the dose of metoprolol to 25 mg twice daily. I switched warfarin to Eliquis. His labs showed a BNP of 260 with a creatinine of 1.99. Hemoglobin was normal. Echocardiogram showed an ejection fraction of 50-55%, moderately dilated left atrium, moderately dilated right atrium and moderate to severe pulmonary hypertension with systolic PA pressure of 60 mmHg. A Holter monitor after increasing metoprolol showed an average heart rate of 72 bpm. He has been doing reasonably well. His symptoms are unchanged. Blood pressure dropped slightly since increasing metoprolol.    Past Medical History  Diagnosis Date  . Gout   . A-fib  (HCC)   . Hypertension   . Hyperlipidemia   . Carotid artery occlusion   . Coronary artery disease     Past Surgical History  Procedure Laterality Date  . Knee surgery Right   . Cardiac catheterization      [facility] x1 Stent  . Carotid endarterectomy       Current Outpatient Prescriptions  Medication Sig Dispense Refill  . allopurinol (ZYLOPRIM) 100 MG tablet Take 100 mg by mouth daily.     Marland Kitchen apixaban (ELIQUIS) 2.5 MG TABS tablet Take 1 tablet (2.5 mg total) by mouth 2 (two) times daily. 180 tablet 3  . busPIRone (BUSPAR) 10 MG tablet Take 10 mg by mouth 2 (two) times daily.     Marland Kitchen donepezil (ARICEPT) 10 MG tablet Take 10 mg by mouth as directed.     . furosemide (LASIX) 40 MG tablet Take 1 tablet (40 mg total) by mouth daily. Takes 1 tablet daily only 4 days a week 90 tablet 3  . latanoprost (XALATAN) 0.005 % ophthalmic solution Place 1 drop into both eyes at bedtime.    . metoprolol tartrate (LOPRESSOR) 25 MG tablet Take 1 tablet (25 mg total) by mouth 2 (two) times daily. 60 tablet 3  . pantoprazole (PROTONIX) 40 MG tablet Take 40 mg by mouth daily.     . pravastatin (PRAVACHOL) 40 MG tablet Take 40 mg by mouth daily.     . sertraline (ZOLOFT) 25 MG tablet Take 25 mg by mouth daily.      No current  facility-administered medications for this visit.    Allergies:   Review of patient's allergies indicates no known allergies.    Social History:  The patient  reports that he has quit smoking. His smoking use included Cigarettes and Cigars. He quit after 38 years of use. He does not have any smokeless tobacco history on file. He reports that he does not drink alcohol or use illicit drugs.   Family History:  The patient's Negative for premature coronary artery disease   ROS:  Please see the history of present illness.   Otherwise, review of systems are positive for none.   All other systems are reviewed and negative.    PHYSICAL EXAM: VS:  There were no vitals taken for this visit.  , BMI There is no weight on file to calculate BMI. GEN: Well nourished, well developed, in no acute distress HEENT: normal Neck: no JVD, carotid bruits, or masses. Scar from left CEA Cardiac: Irregularly irregular; no murmurs, rubs, or gallops,no edema  Respiratory:  clear to auscultation bilaterally, normal work of breathing GI: soft, nontender, nondistended, + BS MS: no deformity or atrophy Skin: warm and dry, no rash Neuro:  Strength and sensation are intact Psych: euthymic mood, full affect   EKG:  EKG is ordered today. The ekg ordered today demonstrates atrial fibrillation with ventricular rate of 81 bpm with nonspecific ST and T wave changes.   Recent Labs: 09/09/2015: BNP 260.7*; Platelets 150 10/06/2015: BUN 36*; Creatinine, Ser 2.40*; Potassium 4.3; Sodium 145*    Lipid Panel No results found for: CHOL, TRIG, HDL, CHOLHDL, VLDL, LDLCALC, LDLDIRECT    Wt Readings from Last 3 Encounters:  09/09/15 229 lb (103.874 kg)  06/11/13 218 lb (98.884 kg)        ASSESSMENT AND PLAN:  1.  Chronic atrial fibrillation:  Ventricular rate is more controlled since increasing the dose of metoprolol. Blood pressure dropped since then but he seems to be mostly asymptomatic. Continue to monitor.  2. Chronic diastolic heart failure with pulmonary hypertension: BNP wasn't elevated. We have to be cautious with diuresis in the setting of advanced chronic kidney disease. He currently appears to be euvolemic. Repeat basic metabolic profile.   3. Coronary artery disease involving native coronary arteries: Significant exertional dyspnea could be angina equivalent. Given his advanced age and chronic kidney disease, I did not pursue ischemic cardiac evaluation.   4. Hyperlipidemia: Continue treatment with pravastatin with a target LDL of less than 70.  5. Dementia: This appears to be mild overall and he continues to be functional.    Disposition:   FU with me in 3 months  Signed,  Lorine BearsMuhammad  Arida, MD  10/14/2015 1:56 PM    [facility]

## 2015-10-14 NOTE — Patient Instructions (Signed)
Medication Instructions:  Your physician recommends that you continue on your current medications as directed. Please refer to the Current Medication list given to you today.   Labwork: BMET  Testing/Procedures: none  Follow-Up: Your physician recommends that you schedule a follow-up appointment in: three months with Dr. Kirke CorinArida.    Any Other Special Instructions Will Be Listed Below (If Applicable).     If you need a refill on your cardiac medications before your next appointment, please call your pharmacy.

## 2015-10-15 LAB — BASIC METABOLIC PANEL
BUN / CREAT RATIO: 19 (ref 10–24)
BUN: 43 mg/dL — ABNORMAL HIGH (ref 8–27)
CHLORIDE: 106 mmol/L (ref 96–106)
CO2: 17 mmol/L — ABNORMAL LOW (ref 18–29)
Calcium: 9.5 mg/dL (ref 8.6–10.2)
Creatinine, Ser: 2.21 mg/dL — ABNORMAL HIGH (ref 0.76–1.27)
GFR, EST AFRICAN AMERICAN: 29 mL/min/{1.73_m2} — AB (ref >59)
GFR, EST NON AFRICAN AMERICAN: 25 mL/min/{1.73_m2} — AB (ref >59)
Glucose: 97 mg/dL (ref 65–99)
POTASSIUM: 4.9 mmol/L (ref 3.5–5.2)
SODIUM: 145 mmol/L — AB (ref 134–144)

## 2015-10-16 ENCOUNTER — Telehealth: Payer: Self-pay | Admitting: Cardiovascular Disease

## 2015-10-16 ENCOUNTER — Other Ambulatory Visit: Payer: Self-pay

## 2015-10-16 MED ORDER — FUROSEMIDE 40 MG PO TABS: 40.0000 mg | ORAL_TABLET | ORAL | Status: DC

## 2015-10-16 NOTE — Telephone Encounter (Signed)
Pharmacist needs clarification on directions for Furosemide. Please call. Ref # [id number]

## 2015-10-16 NOTE — Telephone Encounter (Signed)
A new Rx sent for Furosemide 40 mg take one tablet every other day.

## 2015-10-16 NOTE — Telephone Encounter (Signed)
S/w Abby, Pharmacist at [facility], who asks for confirmation pt takes 40mg  lasix qod. Confirmed dosage w/Abby who verbalized understanding.

## 2016-01-13 ENCOUNTER — Ambulatory Visit: Payer: Medicare HMO | Admitting: Cardiovascular Disease

## 2016-02-23 ENCOUNTER — Ambulatory Visit: Payer: Medicare HMO | Admitting: Cardiovascular Disease

## 2016-03-29 ENCOUNTER — Encounter: Payer: Self-pay | Admitting: Cardiovascular Disease

## 2016-03-29 ENCOUNTER — Ambulatory Visit (INDEPENDENT_AMBULATORY_CARE_PROVIDER_SITE_OTHER): Payer: Medicare HMO | Admitting: Cardiovascular Disease

## 2016-03-29 VITALS — BP 108/62 | HR 75 | Ht 71.0 in | Wt 231.2 lb

## 2016-03-29 DIAGNOSIS — E785 Hyperlipidemia, unspecified: Secondary | ICD-10-CM | POA: Diagnosis not present

## 2016-03-29 DIAGNOSIS — I482 Chronic atrial fibrillation, unspecified: Secondary | ICD-10-CM

## 2016-03-29 DIAGNOSIS — I25119 Atherosclerotic heart disease of native coronary artery with unspecified angina pectoris: Secondary | ICD-10-CM

## 2016-03-29 DIAGNOSIS — I5032 Chronic diastolic (congestive) heart failure: Secondary | ICD-10-CM

## 2016-03-29 NOTE — Patient Instructions (Signed)
Medication Instructions: Continue same medications.   Labwork: None.   Procedures/Testing: None.   Follow-Up: 6 months with Dr. Arida.   Any Additional Special Instructions Will Be Listed Below (If Applicable).     If you need a refill on your cardiac medications before your next appointment, please call your pharmacy.   

## 2016-03-29 NOTE — Progress Notes (Signed)
Cardiology Office Note   Date:  03/29/2016   ID:  Daryl PetersJames H Shaheed Sr., DOB 1923-10-08, MRN [id number]  PCP:  Jaclyn ShaggyATE,DENNY C, MD  Cardiologist:   Lorine BearsMuhammad Millard Bautch, MD   Chief Complaint  Patient presents with  . Other    3 month follow up. Meds reviewed by the pt. verbally. Pt. c/o shortness of breath and feeling lightheaded; pt. fell in the bathroom up against the wall this past Saturday.       History of Present Illness: Daryl PetersJames H Czarnecki Sr. is a 82 y.o. male who ss here today for a follow-up visit regarding chronic atrial fibrillation, coronary artery disease and chronic diastolic heart failure.  There is history of coronary artery disease with previous 1 vessel stenting around 2012. He does have known history of carotid disease status post left carotid endarterectomy, chronic kidney disease and gout. He is a previous smoker and quit many years ago.  He has mild dementia but continues to be functional overall and lives by himself. He is accompanied by his daughter.   Echocardiogram in May, 2017 showed an ejection fraction of 50-55%, moderately dilated left atrium, moderately dilated right atrium and moderate to severe pulmonary hypertension with systolic PA pressure of 60 mmHg. A Holter monitor after increasing metoprolol showed an average heart rate of 72 bpm.  He continues to have significant exertional dyspnea but no chest pain.  Past Medical History:  Diagnosis Date  . A-fib (HCC)   . Carotid artery occlusion   . Coronary artery disease   . Gout   . Hyperlipidemia   . Hypertension     Past Surgical History:  Procedure Laterality Date  . CARDIAC CATHETERIZATION     [facility] x1 Stent  . CAROTID ENDARTERECTOMY    . KNEE SURGERY Right      Current Outpatient Prescriptions  Medication Sig Dispense Refill  . allopurinol (ZYLOPRIM) 100 MG tablet Take 100 mg by mouth daily.     Marland Kitchen. apixaban (ELIQUIS) 2.5 MG TABS tablet Take 1 tablet (2.5 mg total) by mouth 2 (two) times daily. 180  tablet 3  . busPIRone (BUSPAR) 10 MG tablet Take 10 mg by mouth 2 (two) times daily.     Marland Kitchen. donepezil (ARICEPT) 10 MG tablet Take 10 mg by mouth as directed.     . furosemide (LASIX) 40 MG tablet Take 1 tablet (40 mg total) by mouth every other day. 90 tablet 3  . latanoprost (XALATAN) 0.005 % ophthalmic solution Place 1 drop into both eyes at bedtime.    . metoprolol tartrate (LOPRESSOR) 25 MG tablet Take 1 tablet (25 mg total) by mouth 2 (two) times daily. 180 tablet 3  . pantoprazole (PROTONIX) 40 MG tablet Take 40 mg by mouth daily.     . pravastatin (PRAVACHOL) 40 MG tablet Take 40 mg by mouth daily.     . sertraline (ZOLOFT) 25 MG tablet Take 25 mg by mouth daily.      No current facility-administered medications for this visit.     Allergies:   Patient has no known allergies.    Social History:  The patient  reports that he has quit smoking. His smoking use included Cigarettes and Cigars. He quit after 38.00 years of use. He has never used smokeless tobacco. He reports that he does not drink alcohol or use drugs.   Family History:  The patient's Negative for premature coronary artery disease   ROS:  Please see the history of present illness.  Otherwise, review of systems are positive for none.   All other systems are reviewed and negative.    PHYSICAL EXAM: VS:  BP 108/62 (BP Location: Left Arm, Patient Position: Sitting, Cuff Size: Normal)   Pulse 75   Ht 5\' 11"  (1.803 m)   Wt 231 lb 4 oz (104.9 kg)   BMI 32.25 kg/m  , BMI Body mass index is 32.25 kg/m. GEN: Well nourished, well developed, in no acute distress HEENT: normal Neck: no JVD, carotid bruits, or masses. Scar from left CEA Cardiac: Irregularly irregular; no murmurs, rubs, or gallops,no edema  Respiratory:  clear to auscultation bilaterally, normal work of breathing GI: soft, nontender, nondistended, + BS MS: no deformity or atrophy Skin: warm and dry, no rash Neuro:  Strength and sensation are intact Psych:  euthymic mood, full affect   EKG:  EKG is ordered today. The ekg ordered today demonstrates atrial fibrillation with ventricular rate of 81 bpm with nonspecific ST and T wave changes.   Recent Labs: 09/09/2015: BNP 260.7; Platelets 150 10/14/2015: BUN 43; Creatinine, Ser 2.21; Potassium 4.9; Sodium 145    Lipid Panel No results found for: CHOL, TRIG, HDL, CHOLHDL, VLDL, LDLCALC, LDLDIRECT    Wt Readings from Last 3 Encounters:  03/29/16 231 lb 4 oz (104.9 kg)  10/14/15 231 lb 6.4 oz (105 kg)  09/09/15 229 lb (103.9 kg)        ASSESSMENT AND PLAN:  1.  Chronic atrial fibrillation:  Ventricular rate is more controlled since increasing the dose of metoprolol.  Continue anticoagulation with low dose Eliquis given age and chronic kidney disease.  2. Chronic diastolic heart failure with pulmonary hypertension: Continue current dose of furosemide 40 mg every other day.  3. Coronary artery disease involving native coronary arteries:  Continue medical therapy.  4. Hyperlipidemia: Continue treatment with pravastatin with a target LDL of less than 70.  5. Dementia: This appears to be mild overall and he continues to be functional.    Disposition:   FU with me in 6 months  Signed,  Lorine BearsMuhammad Jamise Pentland, MD  03/29/2016 1:25 PM    [facility]

## 2016-04-30 ENCOUNTER — Telehealth: Payer: Self-pay

## 2016-04-30 NOTE — Telephone Encounter (Signed)
Placed samples at front desk for pick up. 

## 2016-04-30 NOTE — Telephone Encounter (Signed)
Pt grandaughter is requesting Eliquis 2.5 mg samples for pt.

## 2016-06-14 ENCOUNTER — Telehealth: Payer: Self-pay

## 2016-06-14 NOTE — Telephone Encounter (Signed)
Pt grandaughter called and request samples of Eliquis for pt.

## 2016-06-14 NOTE — Telephone Encounter (Signed)
LVM for patient to contact office no Eliquis 2.5 mg tablets available.

## 2016-06-14 NOTE — Telephone Encounter (Signed)
Spoke with pt's daughter she is aware that we will contact them once Samples are received pt has enough until Friday.

## 2016-06-15 NOTE — Telephone Encounter (Signed)
Pt granddaughter calling back asking if we can have some of the 5 mg and they can just cut that in half  Please advise.

## 2016-06-15 NOTE — Telephone Encounter (Signed)
Placed samples Eliquis 5 mg tablet pt is aware to cut tablet in 1/2. Pt is aware to Take 1/2 tablet am and 1/2 tablet pm to equal 2.5 mg tablet BID. Meds reviewed verbally with pt.

## 2016-06-29 ENCOUNTER — Telehealth: Payer: Self-pay | Admitting: Cardiovascular Disease

## 2016-06-29 NOTE — Telephone Encounter (Signed)
Patient calling the office for samples of medication:   1.  What medication and dosage are you requesting samples for? Eliquis  2.  Are you currently out of this medication?  He has a few left

## 2016-06-29 NOTE — Telephone Encounter (Signed)
Included instructions to take half tab twice daily.

## 2016-06-29 NOTE — Telephone Encounter (Signed)
Left samples for Eliquis 5mg  at front office for pick up. Notified pt. Lot: [id number] exp: 11/2018

## 2016-08-19 ENCOUNTER — Telehealth: Payer: Self-pay | Admitting: Cardiovascular Disease

## 2016-08-19 ENCOUNTER — Other Ambulatory Visit: Payer: Self-pay | Admitting: Cardiovascular Disease

## 2016-08-19 NOTE — Telephone Encounter (Signed)
  of eliquis. Patient is to cut in half.    4 boxes  Lot# [id number]  Exp 08/2018

## 2016-08-19 NOTE — Telephone Encounter (Signed)
Patient calling the office for samples of medication:   1.  What medication and dosage are you requesting samples for? Eliquis 5 mg (pt takes 2.5 so he cuts the 5 mg in half)  2.  Are you currently out of this medication?  Has enough to last this week

## 2016-09-27 ENCOUNTER — Ambulatory Visit (INDEPENDENT_AMBULATORY_CARE_PROVIDER_SITE_OTHER): Payer: Medicare HMO | Admitting: Cardiovascular Disease

## 2016-09-27 ENCOUNTER — Encounter: Payer: Self-pay | Admitting: Cardiovascular Disease

## 2016-09-27 VITALS — BP 130/80 | HR 83 | Ht 71.0 in | Wt 225.0 lb

## 2016-09-27 DIAGNOSIS — E785 Hyperlipidemia, unspecified: Secondary | ICD-10-CM | POA: Diagnosis not present

## 2016-09-27 DIAGNOSIS — I5032 Chronic diastolic (congestive) heart failure: Secondary | ICD-10-CM | POA: Diagnosis not present

## 2016-09-27 DIAGNOSIS — I2510 Atherosclerotic heart disease of native coronary artery without angina pectoris: Secondary | ICD-10-CM

## 2016-09-27 DIAGNOSIS — I482 Chronic atrial fibrillation, unspecified: Secondary | ICD-10-CM

## 2016-09-27 NOTE — Progress Notes (Signed)
Cardiology Office Note   Date:  09/27/2016   ID:  Daryl PetersJames H Caputi Sr., DOB 06-Oct-1923, MRN [id number]  PCP:  Jaclyn Shaggyate, Denny C, MD  Cardiologist:   Lorine BearsMuhammad Arida, MD   Chief Complaint  Patient presents with  . other    6 month follow up. Meds reviewed by the pt. verbally. Pt. c/o shortness of breath.       History of Present Illness: Daryl PetersJames H Mette Sr. is a 83 y.o. male who ss here today for a follow-up visit regarding chronic atrial fibrillation, coronary artery disease and chronic diastolic heart failure.  There is history of coronary artery disease with previous 1 vessel stenting around 2012. He does have known history of carotid disease status post left carotid endarterectomy, chronic kidney disease and gout. He is a previous smoker and quit many years ago.  He has mild dementia but continues to be functional overall and lives by himself. He is accompanied by his daughter.   Echocardiogram in May, 2017 showed an ejection fraction of 50-55%, moderately dilated left atrium, moderately dilated right atrium and moderate to severe pulmonary hypertension with systolic PA pressure of 60 mmHg.  He has been doing very well and denies any chest pain or palpitations. He reports stable exertional dyspnea. No significant leg edema. He is compliant with his medications.  Past Medical History:  Diagnosis Date  . A-fib (HCC)   . Carotid artery occlusion   . Coronary artery disease   . Gout   . Hyperlipidemia   . Hypertension     Past Surgical History:  Procedure Laterality Date  . CARDIAC CATHETERIZATION     [facility] x1 Stent  . CAROTID ENDARTERECTOMY    . KNEE SURGERY Right      Current Outpatient Prescriptions  Medication Sig Dispense Refill  . allopurinol (ZYLOPRIM) 100 MG tablet Take 100 mg by mouth daily.     Marland Kitchen. apixaban (ELIQUIS) 2.5 MG TABS tablet Take 1 tablet (2.5 mg total) by mouth 2 (two) times daily. 180 tablet 3  . busPIRone (BUSPAR) 10 MG tablet Take 10 mg by mouth 2 (two)  times daily.     Marland Kitchen. donepezil (ARICEPT) 10 MG tablet Take 10 mg by mouth as directed.     . furosemide (LASIX) 40 MG tablet Take 1 tablet (40 mg total) by mouth every other day. 90 tablet 3  . latanoprost (XALATAN) 0.005 % ophthalmic solution Place 1 drop into both eyes at bedtime.    . metoprolol tartrate (LOPRESSOR) 25 MG tablet TAKE 1 TABLET TWICE DAILY 180 tablet 3  . pantoprazole (PROTONIX) 40 MG tablet Take 40 mg by mouth daily.     . pravastatin (PRAVACHOL) 40 MG tablet Take 40 mg by mouth daily.     . sertraline (ZOLOFT) 25 MG tablet Take 25 mg by mouth daily.      No current facility-administered medications for this visit.     Allergies:   Patient has no known allergies.    Social History:  The patient  reports that he has quit smoking. His smoking use included Cigarettes and Cigars. He quit after 38.00 years of use. He has never used smokeless tobacco. He reports that he does not drink alcohol or use drugs.   Family History:  The patient's Negative for premature coronary artery disease   ROS:  Please see the history of present illness.   Otherwise, review of systems are positive for none.   All other systems are reviewed and negative.  PHYSICAL EXAM: VS:  BP 130/80 (BP Location: Left Arm, Patient Position: Sitting, Cuff Size: Normal)   Pulse 83   Ht 5\' 11"  (1.803 m)   Wt 225 lb (102.1 kg)   BMI 31.38 kg/m  , BMI Body mass index is 31.38 kg/m. GEN: Well nourished, well developed, in no acute distress  HEENT: normal  Neck: no JVD, carotid bruits, or masses. Scar from left CEA Cardiac: Irregularly irregular; no murmurs, rubs, or gallops,no edema  Respiratory:  clear to auscultation bilaterally, normal work of breathing GI: soft, nontender, nondistended, + BS MS: no deformity or atrophy  Skin: warm and dry, no rash Neuro:  Strength and sensation are intact Psych: euthymic mood, full affect   EKG:  EKG is ordered today. The ekg ordered today demonstrates atrial  fibrillation with ventricular rate of 83 bpm with nonspecific ST and T wave changes.   Recent Labs: 10/14/2015: BUN 43; Creatinine, Ser 2.21; Potassium 4.9; Sodium 145    Lipid Panel No results found for: CHOL, TRIG, HDL, CHOLHDL, VLDL, LDLCALC, LDLDIRECT    Wt Readings from Last 3 Encounters:  09/27/16 225 lb (102.1 kg)  03/29/16 231 lb 4 oz (104.9 kg)  10/14/15 231 lb 6.4 oz (105 kg)        ASSESSMENT AND PLAN:  1.  Chronic atrial fibrillation:  Ventricular rate Is controlled on current dose of metoprolol.  Continue anticoagulation with low dose Eliquis given age and chronic kidney disease. I reviewed most recent labs done in January which showed a creatinine of 1.95.  2. Chronic diastolic heart failure with pulmonary hypertension: Continue current dose of furosemide 40 mg every other day.  3. Coronary artery disease involving native coronary arteries:  Continue medical therapy.  4. Hyperlipidemia: Continue treatment with pravastatin with a target LDL of less than 70. I reviewed most recent lipid profile done in January which showed an LDL of 88. Switching to a more potent statin is an option.  5. Dementia: This appears to be mild overall and he continues to be functional.    Disposition:   FU with me in 6 months  Signed,  Lorine Bears, MD  09/27/2016 3:33 PM    [facility]

## 2016-09-27 NOTE — Patient Instructions (Signed)
Medication Instructions: Continue same medications.   Labwork: None.   Procedures/Testing: None.   Follow-Up: 6 months with Dr. Arida.   Any Additional Special Instructions Will Be Listed Below (If Applicable).     If you need a refill on your cardiac medications before your next appointment, please call your pharmacy.   

## 2016-11-12 ENCOUNTER — Telehealth: Payer: Self-pay

## 2016-11-12 NOTE — Telephone Encounter (Signed)
Pt granddaughter is calling, states pt needs Eliquis samples.

## 2016-11-12 NOTE — Telephone Encounter (Signed)
S/w pt's granddaughter, Daryl [place], ([place] Pt needs eliquis samples.  Daryl Kemp fills his medication box and has been cutting 5mg  elquis in half for the 2.5mg  dosage prescribed.  Medication Samples have been provided to the patient.  Drug name: Eliquis       Strength: 5mg         Qty: 2 boxes  LOT: [id number]  Exp.Date: 10/20  Dosing instructions: Take 1/2 tablet twice daily.  The patient's granddaughter has been instructed regarding the correct time, dose, and frequency of taking this medication, including desired effects and most common side effects.   Daryl Kemp 10:12 AM 11/12/2016

## 2016-11-24 ENCOUNTER — Other Ambulatory Visit: Payer: Self-pay | Admitting: Cardiovascular Disease

## 2016-11-27 IMAGING — CR DG CHEST 2V
1 series · 2 of 2 positions shown · non-contrast
Comparison: 07/11/2012.  02/27/2013

CLINICAL DATA: Initial encounter for shortness of breath, status
post carotid endarterectomy last week.

EXAM:
CHEST  2 VIEW

[Series 1: dxr chest pa (or ap) and lateral · 0.14mm/px · 2 of 2 slices shown]
[im 1/2]
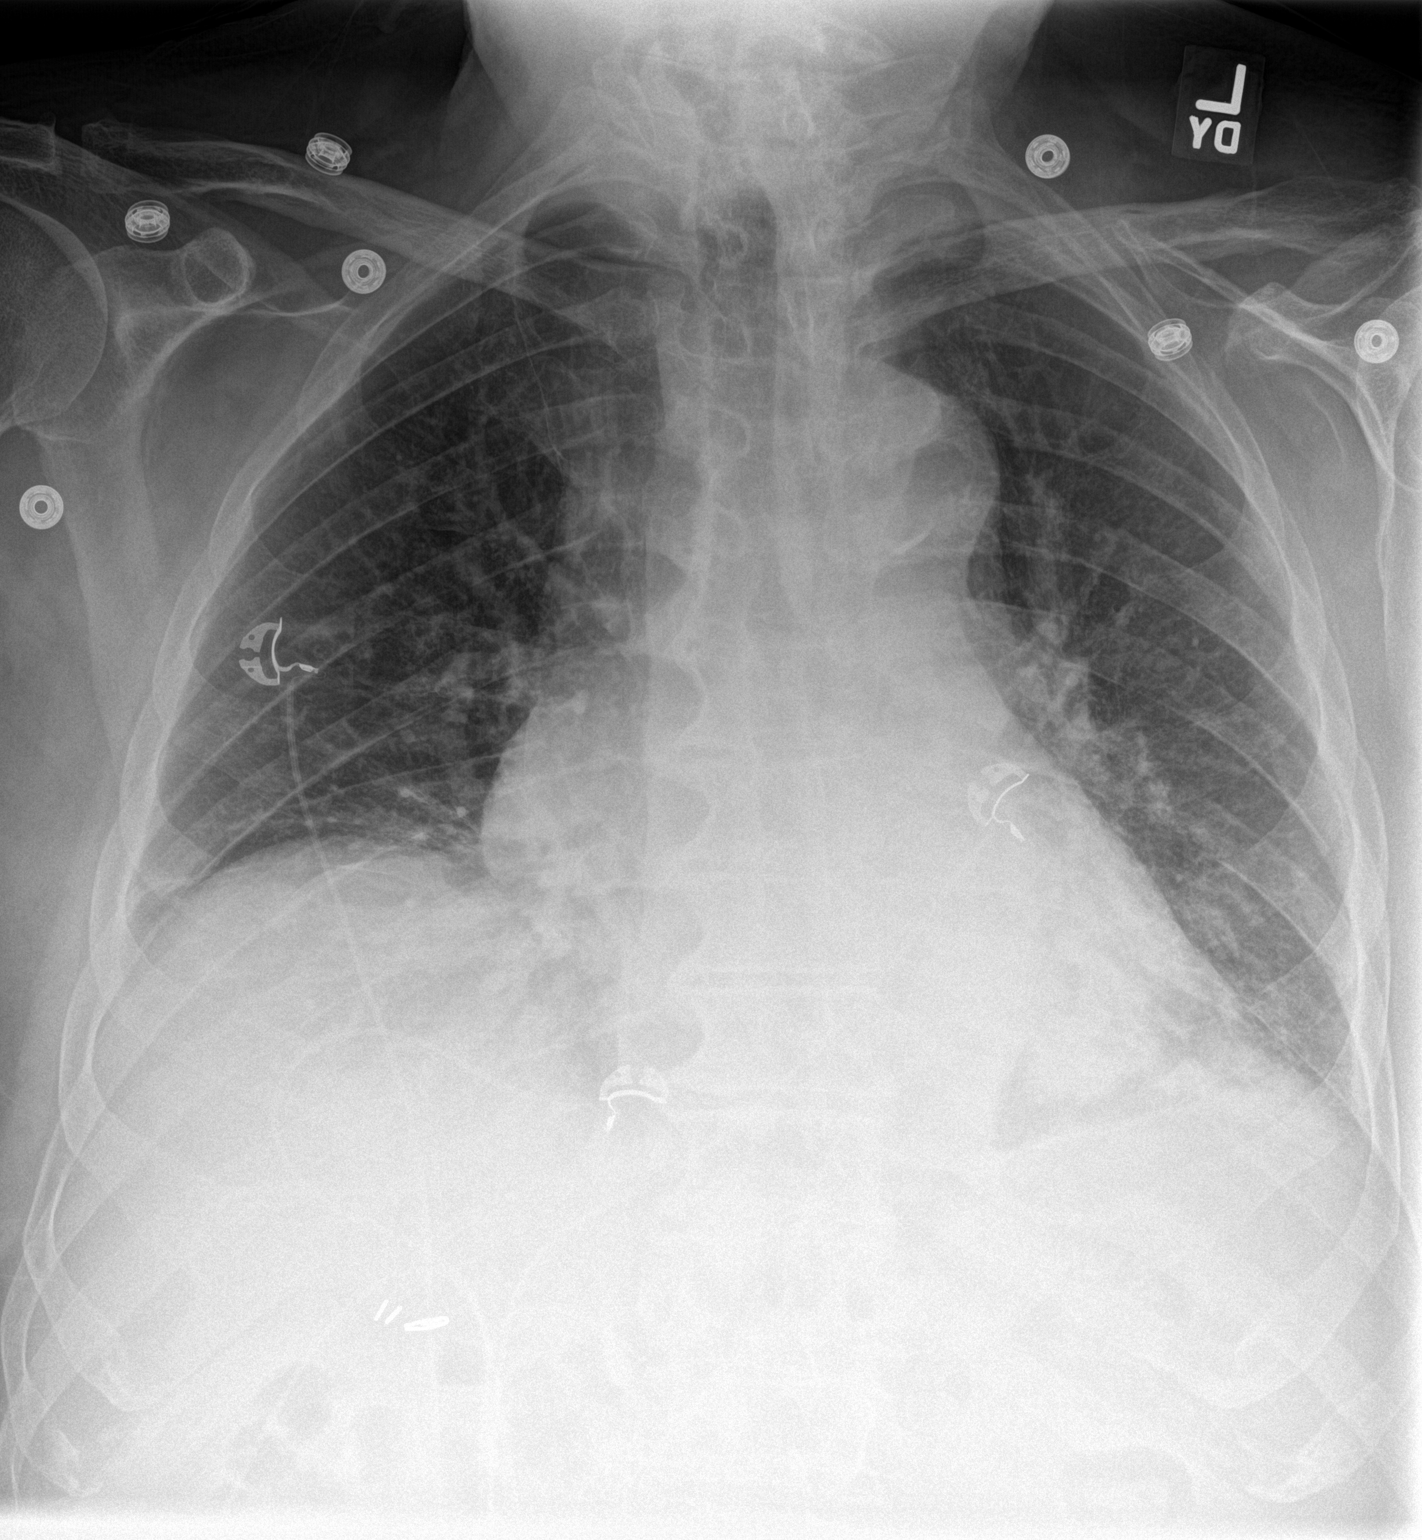
[im 2/2]
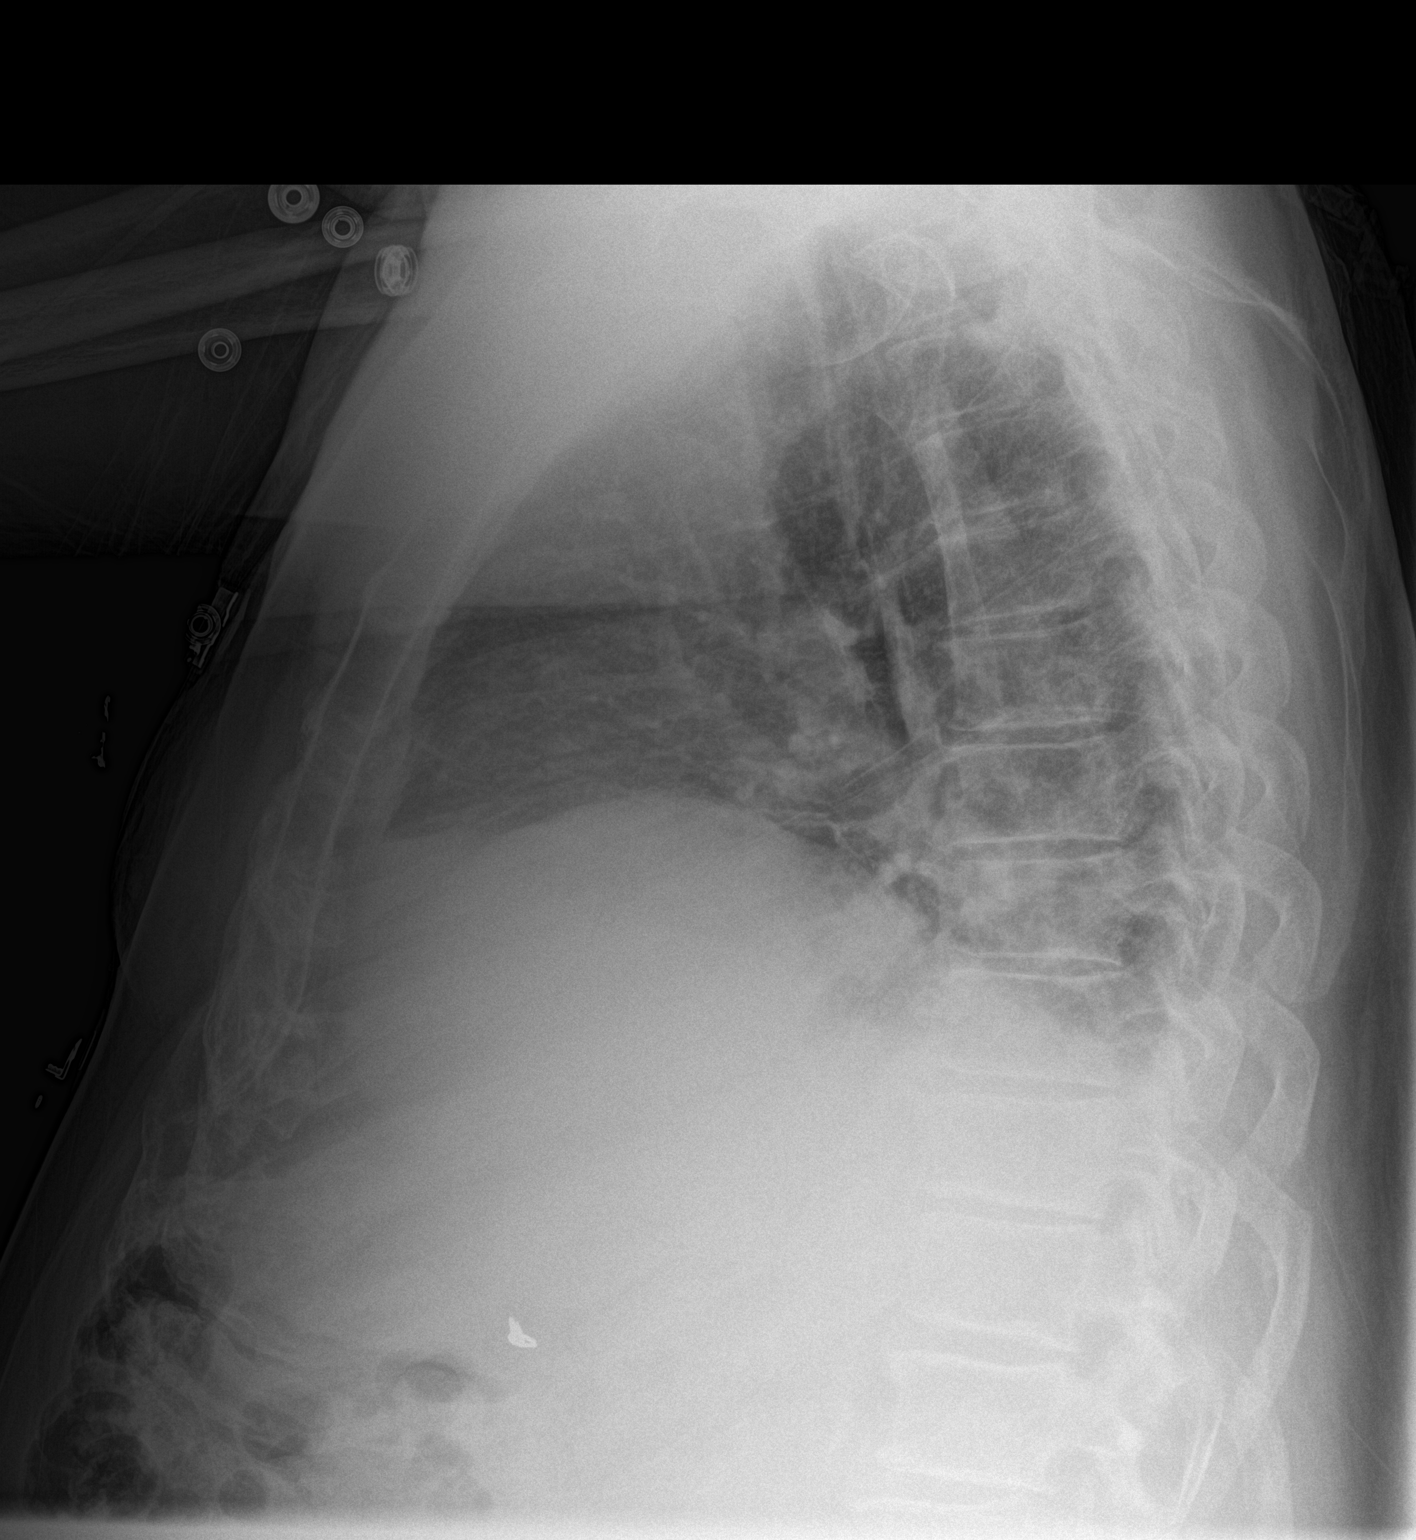

[2 of 2 positions shown; findings below may reference images not displayed]

FINDINGS: Lateral view degraded by patient arm position. Midline trachea.
Cardiomegaly, accentuated by low lung volumes. Mediastinal contours
otherwise within normal limits. No pleural effusion or pneumothorax.
No congestive failure. Increased retrocardiac density on the lateral
view. Similar moderate right hemidiaphragm elevation. Right base
volume loss with patchy left base airspace disease.
IMPRESSION: Cardiomegaly without congestive failure.

Low lung volumes with moderate right hemidiaphragm elevation.
Retrocardiac density on the lateral view, for which left lower lobe
airspace disease (infection or atelectasis) Cannot be excluded.
Consider short term radiographic followup.

## 2016-12-21 ENCOUNTER — Telehealth: Payer: Self-pay

## 2016-12-21 NOTE — Telephone Encounter (Signed)
Patient takes Eliquis 2.5 twice a day.   2.5MG  samples were available.  Spoke with patients granddaughter who fixes his medication and make her aware we will be giving her Eliquis 2.5MG  samples.   4 boxes of Eliquis 2.5MG 

## 2016-12-21 NOTE — Telephone Encounter (Signed)
Pt grandaughter is requesting Eliquis samples. 5 mg.

## 2017-01-19 ENCOUNTER — Telehealth: Payer: Self-pay | Admitting: Cardiovascular Disease

## 2017-01-19 NOTE — Telephone Encounter (Signed)
Patient calling the office for samples of medication:   1.  What medication and dosage are you requesting samples for? Eliquis  2.  Are you currently out of this medication?  Has only one day left.

## 2017-01-19 NOTE — Telephone Encounter (Signed)
Patient's granddaughter Daryl Kemp(Ashley) is aware to take Eliquis 5 mg 1/2 tablet twice a day instead of 2.5 mg daily that was instructed in previous message.

## 2017-01-19 NOTE — Telephone Encounter (Signed)
S/w pt's granddaughter, Morrie Sheldonshley, ([place] Pt needs eliquis samples.  Samples available of Eliquis 5 mg take 1/2 tablet daily to equal 2.5 mg dose that the patient is currently taking.   Samples of this drug were given to the patient, quantity 3 boxes of 14 tablets in each , Lot Number [id number]  Exp. 12/20.

## 2017-01-19 NOTE — Telephone Encounter (Signed)
Pt's granddaughter Morrie Sheldonshley called to confirm proper eliquis dose as instructions on 5mg  samples she just picked up state to take 1/2 tablet once daily.  Confirmed pt has been prescribed eliquis 2.5mg  twice a day.  Morrie Sheldonshley verbalized understanding of correct dosage and states pt has been taking as prescribed.

## 2017-01-19 NOTE — Telephone Encounter (Signed)
Grandaughter has a question regarding Eliquis doseage. Please call.

## 2017-03-04 ENCOUNTER — Telehealth: Payer: Self-pay | Admitting: Cardiovascular Disease

## 2017-03-04 ENCOUNTER — Telehealth: Payer: Self-pay

## 2017-03-04 MED ORDER — APIXABAN 2.5 MG PO TABS: 2.5000 mg | ORAL_TABLET | Freq: Two times a day (BID) | ORAL | 1 refills | Status: DC

## 2017-03-04 NOTE — Telephone Encounter (Signed)
Pt daughter is calling stating pt is in donut hole  Patient calling the office for samples of medication:   1.  What medication and dosage are you requesting samples for? eliquis  2.  Are you currently out of this medication?  Pt is completely out

## 2017-03-04 NOTE — Telephone Encounter (Signed)
Pt grandaughter is requesting Eliquis samples.

## 2017-03-04 NOTE — Telephone Encounter (Signed)
Reviewed with patients daughter per release form that we do not have any samples available at this time. She does not remember ever using a free 30 day card so sent in additional prescription to local pharmacy for her to see if eligible. Advised that she could call back next week to check but that we do not have any at this time. She was appreciative for the call and had no further questions at this time.

## 2017-03-04 NOTE — Telephone Encounter (Signed)
Left detailed voicemail message that we do not have any samples available at this time and to check back in a couple of weeks and to call back if any further questions or concerns.

## 2017-03-04 NOTE — Telephone Encounter (Signed)
Please review your previous note for this patient with refills and samples.

## 2017-03-04 NOTE — Telephone Encounter (Signed)
Spoke with daughter per release form and provided her card information for free 30 days. She was appreciative for the call and had no further questions at this time.

## 2017-03-04 NOTE — Telephone Encounter (Signed)
Granddaughter calling stating the number on the back of the card we gave to them is not working.   She states If we can call back to verify what they took down is correct

## 2017-03-04 NOTE — Telephone Encounter (Signed)
Pt requesting Eliquis 2.5 mg tablet samples. Please advise.

## 2017-03-08 ENCOUNTER — Telehealth: Payer: Self-pay | Admitting: Cardiovascular Disease

## 2017-03-08 NOTE — Telephone Encounter (Signed)
Patient daughter calling to see if we have received additional samples

## 2017-03-08 NOTE — Telephone Encounter (Signed)
S/w pt's daughter, Marylene Landngela, who reports they tried to print free 30-day eliquis form from computer but it would not work. She asks for eliquis samples, stating pt's cost is over $200 per month until January. Per daughter, he applied to patient assistance but did not qualify. Explained that we do not have many samples but can provide her 2 boxes and a 30-day card. She is appreciative and agreeable.  Samples of Eliquis 2.5mg  were given to the patient, quantity 2 boxes, Lot Number [id number]   Exp 12/19

## 2017-03-09 ENCOUNTER — Other Ambulatory Visit: Payer: Self-pay

## 2017-03-09 ENCOUNTER — Telehealth: Payer: Self-pay | Admitting: Cardiovascular Disease

## 2017-03-09 NOTE — Telephone Encounter (Signed)
Pharmacy calling stating pt Eliquis is needing a refill  Pt is taking 2.5 mg  2 x a day but patient is in coverage gap They can't afford it They were given 30 day and can use it but would need us to send in 5 BID  they can split it in half and get that covered   Would like to know if we can do this to help patient.

## 2017-03-09 NOTE — Telephone Encounter (Signed)
Spoke with pharmacist from [facility].  Patient's daughter told them she wants us to send a a prescription for Eliquis 5MG  BID until they can get out of the doughnut hole. He explained he would not do that or make that change due to that being a lie and it would be on record. Told the pharmacist to just do a refill for Eliquis 2.5MG  BID as prescribed by Dr. Kirke CorinArida and I would follow up with patient and patients daughter.

## 2017-03-10 ENCOUNTER — Other Ambulatory Visit: Payer: Self-pay

## 2017-03-10 NOTE — Telephone Encounter (Signed)
Left message on machine for patient to contact the office if she has any questions regarding eliquis prescription

## 2017-03-10 NOTE — Telephone Encounter (Signed)
Please review and call daughter, Please.

## 2017-03-15 ENCOUNTER — Telehealth: Payer: Self-pay | Admitting: Cardiovascular Disease

## 2017-03-15 NOTE — Telephone Encounter (Signed)
Patient daughter wants to know if there is anyway he can have additional eliquis samples as he is in the donut hole

## 2017-03-15 NOTE — Telephone Encounter (Signed)
Please advise 

## 2017-03-15 NOTE — Telephone Encounter (Signed)
Patient calling the office for samples of medication:   1.  What medication and dosage are you requesting samples for?   eliquis  2.  Are you currently out of this medication? Yes in the donut hole

## 2017-03-15 NOTE — Telephone Encounter (Signed)
Sent to Mardene CelesteSharon Yow for her to advise.

## 2017-03-15 NOTE — Telephone Encounter (Signed)
Left message on daughter Angie's VM. Daughter requesting more eliquis samples. She was given 2 boxes plus a free 30-day card 7 days ago. We will not be able to give them more samples.

## 2017-04-29 ENCOUNTER — Telehealth: Payer: Self-pay | Admitting: Cardiovascular Disease

## 2017-04-29 NOTE — Telephone Encounter (Signed)
Pt daughter calling asking for Samples on Eliquis   They are out and need about 3 weeks worth.   Please advise.

## 2017-04-29 NOTE — Telephone Encounter (Signed)
Ok to give 2 boxes of samples as patient in the donut hole, but they will need to start getting this filled at the pharmacy as samples are technically for new starts.

## 2017-04-29 NOTE — Telephone Encounter (Signed)
Please advise if I should provide patient with samples. Since last office visit patient has received 11 bottles.

## 2017-05-02 NOTE — Telephone Encounter (Signed)
Pt daughter is calling back regarding samples.

## 2017-05-03 ENCOUNTER — Telehealth: Payer: Self-pay | Admitting: Cardiovascular Disease

## 2017-05-03 NOTE — Telephone Encounter (Signed)
Patient calling the office for samples of medication:   1.  What medication and dosage are you requesting samples for? Eliquis 2.5 mg po BID   2.  Are you currently out of this medication? YES

## 2017-05-03 NOTE — Telephone Encounter (Signed)
I s/w pt's daughter who reports pt has been out of eliquis for two days. He does [facility] an [facility] but has not filled it. Explained that pt has received eliquis samples every month this year and that per policy, we are unable to continue to provide these. Suggested she contact pharmacy for a two week cost.  As the patient should not be without eliquis, samples have been left at the front desk for pick up. Marylene Landngela understands that we will not be able to provide any more samples after these. If he is unable to afford eliquis, pt may need to switch to coumadin. Marylene Landngela states she will discuss with her daughter and call back.   Samples of this drug were given to the patient, quantity 2 boxes, Lot Number [id number]  Exp: 2/19

## 2017-05-03 NOTE — Telephone Encounter (Signed)
Please advise about samples 

## 2017-05-04 NOTE — Telephone Encounter (Signed)
Samples placed up from per previous note by Mardene Celestesharon yow, RN

## 2017-05-16 NOTE — Telephone Encounter (Signed)
Pt taking eliquis 2.5mg  for chronic atrial fibrillation.  Pt has been receiving monthly eliquis samples from the office. I have discussed with daughter several times that we can not continue to supply this medication.  Daughter and granddaughter discussed option as it is cost prohibitive.   Pt's granddaughter, Daryl Kemp, called today regarding blood thinner. She has discussed with patient and pt's daughter and all are agreeable to switch eliquis to coumadin if MD approves. She understands pt will need to come to the office for frequent INR checks and education. Home Health will provide transportation and once pt is therapeutic, [facility] during [facility].   As Dr. Kirke CorinArida is out of the office, I will route to Dr. Mariah MillingGollan to further advise.

## 2017-05-16 NOTE — Telephone Encounter (Signed)
Please call regarding Eliquis, to discuss treatment. Grandaughter states he will not be able to come to the office for coumadin check. Please call.

## 2017-05-16 NOTE — Telephone Encounter (Signed)
Does patient qualify for company assistance? Would probably provide a week of samples and discussed with Dr. Kirke CorinArida when he returns

## 2017-05-18 NOTE — Telephone Encounter (Signed)
Granddaughter Morrie Sheldonshley called as pt is out of Eliquis; awaiting approval to switch to more affordable coumadin.  Dr. Kirke CorinArida is out of the office until Friday; Dr. Mariah MillingGollan deferred approval to Dr. Kirke CorinArida. Morrie Sheldonshley will pick up one week of eliquis samples until approval for coumadin is given.  Samples of this drug were given to the patient, quantity 1 box, Lot Number [id number] Exp: 12/19

## 2017-05-18 NOTE — Telephone Encounter (Signed)
Patient is out of Eliquis . Please call to discuss plan

## 2017-05-22 NOTE — Telephone Encounter (Signed)
Eliquis is better than Warfarin but if he can not afford it, then ok to switch.

## 2017-05-23 ENCOUNTER — Other Ambulatory Visit: Payer: Self-pay

## 2017-05-23 DIAGNOSIS — Z5181 Encounter for therapeutic drug level monitoring: Secondary | ICD-10-CM

## 2017-05-23 DIAGNOSIS — I482 Chronic atrial fibrillation, unspecified: Secondary | ICD-10-CM

## 2017-05-23 MED ORDER — WARFARIN SODIUM 5 MG PO TABS: 5.0000 mg | ORAL_TABLET | ORAL | 0 refills | Status: DC

## 2017-05-23 NOTE — Telephone Encounter (Addendum)
Reviewed with pt's granddaughter, Morrie Sheldonshley. She again asks if eliquis 5mg  BID prescription can be sent in and she would cut in half so he could get 2 months for the price of 1. Explained I can not/will not do this as that is the incorrect dosage and is fraud.  She vocalized understanding and requests that coumadin 5mg  tablets be sent to [facility]. Pt will take last Eliquis on Wednesday, start coumadin, 5mg  qd, Thursday, January 10 and be seen in coumadin clinic Monday, January 14, 10:30am. Pt agreeable. Reviewed with Darla LeschesMandi Moody, RN.

## 2017-05-30 ENCOUNTER — Telehealth: Payer: Self-pay | Admitting: Cardiovascular Disease

## 2017-05-30 ENCOUNTER — Other Ambulatory Visit: Payer: Self-pay

## 2017-05-30 MED ORDER — APIXABAN 2.5 MG PO TABS: 2.5000 mg | ORAL_TABLET | Freq: Two times a day (BID) | ORAL | 2 refills | Status: AC

## 2017-05-30 NOTE — Telephone Encounter (Signed)
Pt grandaughter states he would like to stay on Eliquis. Please call to discuss. States he does not like the way it makes him feel. States he feels weak.  She did cancel his coumadin appt. Please call and discuss.

## 2017-05-30 NOTE — Telephone Encounter (Signed)
S/w pt's granddaughter, Oneal Groutshley Mann, who reports patient does not want to take coumadin because he does not want to come for INR checks. Pt told granddaughter he wants to restart Elqiuis 2.5mg  BID. He took coumadin 05/25/17 - 05/29/17.  [facility]. Patient may restart Eliquis; no INR check needed.  Morrie SheldonAshley requests prescription to [facility], stating she got a week's worth of Eliquis samples from somewhere in [place]. She understands that we will not be able to provide samples.  Confirmed that Morrie Sheldonshley fills patient's pill packs, not [facility].  Prescription sent. Coumadin appt cancelled. Medication list updated.

## 2017-07-15 DEATH — deceased
